# Patient Record
Sex: Male | Born: 1955 | Race: White | Hispanic: No | Marital: Single | Smoking: Former smoker
Health system: Southern US, Community
[De-identification: ages and names within clinical notes are randomized; demographics above are authoritative.]

## PROBLEM LIST (undated history)

## (undated) DIAGNOSIS — C801 Malignant (primary) neoplasm, unspecified: Secondary | ICD-10-CM

## (undated) DIAGNOSIS — Z9889 Other specified postprocedural states: Secondary | ICD-10-CM

## (undated) DIAGNOSIS — I059 Rheumatic mitral valve disease, unspecified: Secondary | ICD-10-CM

## (undated) DIAGNOSIS — Z8774 Personal history of (corrected) congenital malformations of heart and circulatory system: Secondary | ICD-10-CM

## (undated) DIAGNOSIS — E785 Hyperlipidemia, unspecified: Secondary | ICD-10-CM

## (undated) DIAGNOSIS — R011 Cardiac murmur, unspecified: Secondary | ICD-10-CM

## (undated) HISTORY — PX: HERNIA REPAIR: SHX51

## (undated) HISTORY — PX: TONSILLECTOMY: SUR1361

## (undated) HISTORY — PX: CARDIAC CATHETERIZATION: SHX172

## (undated) HISTORY — DX: Rheumatic mitral valve disease, unspecified: I05.9

## (undated) HISTORY — PX: MELANOMA EXCISION: SHX5266

## (undated) HISTORY — DX: Hyperlipidemia, unspecified: E78.5

---

## 1999-12-21 ENCOUNTER — Observation Stay (HOSPITAL_COMMUNITY): Admission: EM | Admit: 1999-12-21 | Discharge: 1999-12-22 | Payer: Self-pay | Admitting: Emergency Medicine

## 2010-10-03 ENCOUNTER — Emergency Department (HOSPITAL_COMMUNITY)
Admission: EM | Admit: 2010-10-03 | Discharge: 2010-10-04 | Disposition: A | Discharge status: Home / Self Care | Payer: 59 | Attending: Emergency Medicine | Admitting: Emergency Medicine

## 2010-10-03 DIAGNOSIS — R110 Nausea: Secondary | ICD-10-CM | POA: Insufficient documentation

## 2010-10-03 DIAGNOSIS — R55 Syncope and collapse: Secondary | ICD-10-CM | POA: Insufficient documentation

## 2010-10-03 DIAGNOSIS — E789 Disorder of lipoprotein metabolism, unspecified: Secondary | ICD-10-CM | POA: Insufficient documentation

## 2010-10-03 DIAGNOSIS — Z7982 Long term (current) use of aspirin: Secondary | ICD-10-CM | POA: Insufficient documentation

## 2010-10-03 DIAGNOSIS — R109 Unspecified abdominal pain: Secondary | ICD-10-CM | POA: Insufficient documentation

## 2010-10-03 DIAGNOSIS — R5381 Other malaise: Secondary | ICD-10-CM | POA: Insufficient documentation

## 2010-10-03 LAB — DIFFERENTIAL
Basophils Absolute: 0 10*3/uL (ref 0.0–0.1)
Lymphocytes Relative: 9 % — ABNORMAL LOW (ref 12–46)
Monocytes Absolute: 0.8 10*3/uL (ref 0.1–1.0)
Neutro Abs: 8.5 10*3/uL — ABNORMAL HIGH (ref 1.7–7.7)

## 2010-10-03 LAB — CBC
HCT: 40.9 % (ref 39.0–52.0)
Hemoglobin: 14.5 g/dL (ref 13.0–17.0)
MCHC: 35.5 g/dL (ref 30.0–36.0)
WBC: 10.3 10*3/uL (ref 4.0–10.5)

## 2010-10-03 LAB — COMPREHENSIVE METABOLIC PANEL
ALT: 35 U/L (ref 0–53)
Alkaline Phosphatase: 67 U/L (ref 39–117)
CO2: 21 mEq/L (ref 19–32)
GFR calc non Af Amer: 54 mL/min — ABNORMAL LOW (ref >60)
Glucose, Bld: 140 mg/dL — ABNORMAL HIGH (ref 70–99)
Potassium: 3.9 mEq/L (ref 3.5–5.1)
Sodium: 132 mEq/L — ABNORMAL LOW (ref 135–145)

## 2010-10-03 LAB — LIPASE, BLOOD: Lipase: 35 U/L (ref 11–59)

## 2010-10-04 ENCOUNTER — Encounter (HOSPITAL_COMMUNITY): Payer: Self-pay | Admitting: Radiology

## 2010-10-04 ENCOUNTER — Emergency Department (HOSPITAL_COMMUNITY): Payer: 59

## 2010-10-04 LAB — URINALYSIS, ROUTINE W REFLEX MICROSCOPIC
Bilirubin Urine: NEGATIVE
Hgb urine dipstick: NEGATIVE
Protein, ur: NEGATIVE mg/dL
Urobilinogen, UA: 1 mg/dL (ref 0.0–1.0)

## 2010-10-04 MED ORDER — IOHEXOL 300 MG/ML  SOLN
80.0000 mL | Freq: Once | INTRAMUSCULAR | Status: AC | PRN
  Administered 2010-10-04: 80 mL via INTRAVENOUS

## 2013-07-05 ENCOUNTER — Encounter: Payer: Self-pay | Admitting: Interventional Cardiology

## 2013-07-05 ENCOUNTER — Ambulatory Visit (INDEPENDENT_AMBULATORY_CARE_PROVIDER_SITE_OTHER): Payer: 59 | Admitting: Interventional Cardiology

## 2013-07-05 VITALS — BP 137/72 | HR 74 | Ht 70.0 in | Wt 164.0 lb

## 2013-07-05 DIAGNOSIS — I059 Rheumatic mitral valve disease, unspecified: Secondary | ICD-10-CM

## 2013-07-05 DIAGNOSIS — E782 Mixed hyperlipidemia: Secondary | ICD-10-CM | POA: Insufficient documentation

## 2013-07-05 NOTE — Progress Notes (Signed)
Patient ID: John Page, male   DOB: [birth year], 57 y.o.   MRN: [id number]    [address] [place], [place]  [address] Phone: [phone] Fax:  [phone]  Date:  07/05/2013   ID:  John Page, DOB 11-07-1955, MRN [id number]  PCP:  Lolita Patella, MD      History of Present Illness: John Page is a 57 y.o. male who had a new heart murmur found on exam in 2013. He had an echo showing MVP with moderate MR. He had no complaints at the time. He exercises on a regalar basis including running. Walks 2-3 x/weeek along wtih chinups, pushups, situps. No change in exercise tolerance. Murmur:  Denies : Chest pain.  Dizziness.  Fatigue.  Leg edema.  Orthopnea.  Syncope.     Wt Readings from Last 3 Encounters:  07/05/13 164 lb (74.39 kg)     No past medical history on file.  No current outpatient prescriptions on file.   No current facility-administered medications for this visit.    Allergies:   No Known Allergies  Social History:  The patient  reports that he quit smoking about 20 years ago. He does not have any smokeless tobacco history on file.   Family History:  The patient's family history is not on file.   ROS:  Please see the history of present illness.  No nausea, vomiting.  No fevers, chills.  No focal weakness.  No dysuria.   All other systems reviewed and negative.   PHYSICAL EXAM: VS:  BP 137/72  Pulse 74  Ht 5\' 10"  (1.778 m)  Wt 164 lb (74.39 kg)  BMI 23.53 kg/m2 Well nourished, well developed, in no acute distress HEENT: normal Neck: no JVD, no carotid bruits Cardiac:  normal S1, S2; RRR; 3/6 late systolic murmur Lungs:  clear to auscultation bilaterally, no wheezing, rhonchi or rales Abd: soft, nontender, no hepatomegaly Ext: no edema Skin: warm and dry Neuro:   no focal abnormalities noted  EKG:  NSR, NSST    ASSESSMENT AND PLAN:  Mitral valve prolapse  Start Amoxicillin Capsule, 500 MG, 4 capsule, Orally, one hour  before dental treatement, 10 day(s), 4, Refills 2 Notes: Would recommend SBE prophylaxis with amoxicillin 2 grams 1 hour before dental treatment. Moderate mitral Regurgitation. No symptoms fromthe valve leak. No evidence of CHF. Plan for echo.    2. Hyperlipidemia  Notes: Last LDL 147 from 176. In October 2014, LDL 158. HDL 47. Pravastatin was recommended but he declined. Ideally, would like to see his LDL less than 130. Minimum, his LDL should be less than 160. if this does not come down with dietary changes and exercise, would have to consider statin.    Preventive Medicine  Adult topics discussed:  Diet: healthy diet.  Exercise: 5 days a week, at least 30 minutes of aerobic exercise.      Signed, Fredric Mare, MD, [facility] 07/05/2013 10:35 AM

## 2013-07-05 NOTE — Patient Instructions (Signed)
Your physician has requested that you have an echocardiogram within the next week. Echocardiography is a painless test that uses sound waves to create images of your heart. It provides your doctor with information about the size and shape of your heart and how well your heart's chambers and valves are working. This procedure takes approximately one hour. There are no restrictions for this procedure.  Your physician wants you to follow-up in: 1 year with Dr. Eldridge Dace. You will receive a reminder letter in the mail two months in advance. If you don't receive a letter, please call our office to schedule the follow-up appointment.  Your physician recommends that you continue on your current medications as directed. Please refer to the Current Medication list given to you today.

## 2013-07-12 ENCOUNTER — Encounter: Payer: Self-pay | Admitting: Cardiology

## 2013-07-12 ENCOUNTER — Ambulatory Visit (HOSPITAL_COMMUNITY): Payer: 59 | Attending: Cardiology | Admitting: Radiology

## 2013-07-12 DIAGNOSIS — I77819 Aortic ectasia, unspecified site: Secondary | ICD-10-CM | POA: Insufficient documentation

## 2013-07-12 DIAGNOSIS — I079 Rheumatic tricuspid valve disease, unspecified: Secondary | ICD-10-CM | POA: Insufficient documentation

## 2013-07-12 DIAGNOSIS — I059 Rheumatic mitral valve disease, unspecified: Secondary | ICD-10-CM | POA: Insufficient documentation

## 2013-07-12 DIAGNOSIS — E785 Hyperlipidemia, unspecified: Secondary | ICD-10-CM | POA: Insufficient documentation

## 2013-07-12 DIAGNOSIS — I359 Nonrheumatic aortic valve disorder, unspecified: Secondary | ICD-10-CM | POA: Insufficient documentation

## 2013-07-12 NOTE — Progress Notes (Signed)
Echocardiogram performed.  

## 2014-02-05 ENCOUNTER — Telehealth: Payer: Self-pay | Admitting: Interventional Cardiology

## 2014-02-05 NOTE — Telephone Encounter (Signed)
New message    Patient calling    Does he suppose to take his antibiotic prior to colonoscopy.

## 2014-02-06 NOTE — Telephone Encounter (Signed)
Spoke with pt and let him know that we usually do not dispense antibiotics prior to colonoscopy, but he will still need to take antibiotics prior to dental.

## 2014-03-11 ENCOUNTER — Telehealth: Payer: Self-pay | Admitting: *Deleted

## 2014-03-11 MED ORDER — AMOXICILLIN 500 MG PO CAPS: ORAL_CAPSULE | ORAL | Status: DC

## 2014-03-11 NOTE — Telephone Encounter (Signed)
Refilled. Lmom to make pt aware.

## 2014-03-11 NOTE — Telephone Encounter (Signed)
Patient has a dental appointment next week and would like an antibiotic sent to [facility] [address]. He would like a call when this has been done. Thanks, MI

## 2014-07-02 ENCOUNTER — Encounter: Payer: Self-pay | Admitting: *Deleted

## 2014-07-03 ENCOUNTER — Encounter: Payer: Self-pay | Admitting: Interventional Cardiology

## 2014-07-03 ENCOUNTER — Ambulatory Visit (INDEPENDENT_AMBULATORY_CARE_PROVIDER_SITE_OTHER): Payer: 59 | Admitting: Interventional Cardiology

## 2014-07-03 VITALS — BP 126/82 | HR 64 | Ht 70.5 in | Wt 163.6 lb

## 2014-07-03 DIAGNOSIS — I059 Rheumatic mitral valve disease, unspecified: Secondary | ICD-10-CM

## 2014-07-03 DIAGNOSIS — E782 Mixed hyperlipidemia: Secondary | ICD-10-CM

## 2014-07-03 NOTE — Progress Notes (Signed)
Patient ID: John Page, male   DOB: [birth year], 58 y.o.   MRN: [id number] Patient ID: John Page, male   DOB: [birth year], 58 y.o.   MRN: [id number]    [place], [place],   [address] Phone: [phone] Fax:  [phone]  Date:  07/03/2014   ID:  John Page, DOB [birth year], MRN [id number]  PCP:  Vena Austria, MD      History of Present Illness: John Page is a 58 y.o. male who had a new heart murmur found on exam in 2013. He had an echo showing MVP with moderate MR. He had no complaints at the time. Aerobic exercise is decreased due to a knee injury.  He does chinups, pushups, situps. No change in exercise tolerance.  Murmur:  Denies : Chest pain.  Dizziness.  Fatigue.  Leg edema.  Orthopnea.  Syncope.   Walking briskly at the park on several occasions.  No cardiac sx.      Wt Readings from Last 3 Encounters:  07/03/14 163 lb 9.6 oz (74.208 kg)  07/05/13 164 lb (74.39 kg)     Past Medical History  Diagnosis Date  . Other and unspecified hyperlipidemia   . Mitral valve disorders     Current Outpatient Prescriptions  Medication Sig Dispense Refill  . amoxicillin (AMOXIL) 500 MG capsule 4 capsules 1 hour prior to dental (Patient not taking: Reported on 07/03/2014) 4 capsule 2   No current facility-administered medications for this visit.    Allergies:   No Known Allergies  Social History:  The patient  reports that he quit smoking about 21 years ago. He does not have any smokeless tobacco history on file.   Family History:  The patient's family history includes Heart attack in his mother; Heart disease in his mother; Hypertension in his brother and mother; Lung cancer in his father.   ROS:  Please see the history of present illness.  No nausea, vomiting.  No fevers, chills.  No focal weakness.  No dysuria.   All other systems reviewed and negative.   PHYSICAL EXAM: VS:  BP 126/82 mmHg  Pulse 64  Ht 5' 10.5" (1.791 m)   Wt 163 lb 9.6 oz (74.208 kg)  BMI 23.13 kg/m2 Well nourished, well developed, in no acute distress HEENT: normal Neck: no JVD, no carotid bruits Cardiac:  normal S1, S2; RRR; 3/6 late systolic murmur Lungs:  clear to auscultation bilaterally, no wheezing, rhonchi or rales Abd: soft, nontender, no hepatomegaly Ext: no edema Skin: warm and dry Neuro:   no focal abnormalities noted Psych: normal affect  EKG:  NSR, NSST    ASSESSMENT AND PLAN:  Mitral valve prolapse  Continue Amoxicillin Capsule, 500 MG, 4 capsule, Orally, one hour before dental treatement, 10 day(s), 4, Refills 2 Notes: Would recommend SBE prophylaxis with amoxicillin 2 grams 1 hour before dental treatment. Moderate mitral Regurgitation. No symptoms from the valve leak. No evidence of CHF.  If there is any change in sx, would repeat echo sooner. Otherwise, Will plan echo in 12/16.    2. Hyperlipidemia  Notes: Last LDL 147 from 176. In October 2014, LDL 158. HDL 47. Pravastatin was recommended but he declined. Ideally, would like to see his LDL less than 130. Minimum, his LDL should be less than 160. if this does not come down with dietary changes and exercise, would have to consider statin. He still is hesitant to take a statin.    Preventive  Medicine  Adult topics discussed:  Diet: healthy diet.  Exercise: 5 days a week, at least 30 minutes of aerobic exercise.      Signed, Mina Marble, MD, [facility] 07/03/2014 2:18 PM

## 2014-07-03 NOTE — Patient Instructions (Signed)
Your physician recommends that you continue on your current medications as directed. Please refer to the Current Medication list given to you today.  Your physician wants you to follow-up in: 1 year with Dr. Irish Lack. You will receive a reminder letter in the mail two months in advance. If you don't receive a letter, please call our office to schedule the follow-up appointment.  Your physician has requested that you have an echocardiogram in 1 year. Echocardiography is a painless test that uses sound waves to create images of your heart. It provides your doctor with information about the size and shape of your heart and how well your heart's chambers and valves are working. This procedure takes approximately one hour. There are no restrictions for this procedure.

## 2015-04-10 ENCOUNTER — Encounter: Payer: Self-pay | Admitting: Interventional Cardiology

## 2015-06-04 ENCOUNTER — Other Ambulatory Visit: Payer: Self-pay | Admitting: Interventional Cardiology

## 2015-06-04 MED ORDER — AMOXICILLIN 500 MG PO CAPS: ORAL_CAPSULE | ORAL | Status: DC

## 2015-06-04 NOTE — Telephone Encounter (Signed)
Spoke with pt and verified pharmacy. Sent in prescription.

## 2015-06-04 NOTE — Telephone Encounter (Signed)
Pt calling stating that he is having a dental procedure done on 06/16/2015 and he needs a refill on Amoxicillin 500 mg, before the procedure. Please advise

## 2015-06-08 NOTE — Telephone Encounter (Signed)
Please refer to the pts PCP. Thanks. 

## 2015-07-02 ENCOUNTER — Other Ambulatory Visit: Payer: Self-pay | Admitting: Interventional Cardiology

## 2015-07-02 DIAGNOSIS — I059 Rheumatic mitral valve disease, unspecified: Secondary | ICD-10-CM

## 2015-07-27 ENCOUNTER — Other Ambulatory Visit (HOSPITAL_COMMUNITY): Payer: Self-pay

## 2015-07-29 ENCOUNTER — Other Ambulatory Visit: Payer: Self-pay

## 2015-07-29 ENCOUNTER — Ambulatory Visit (HOSPITAL_COMMUNITY): Payer: Commercial Managed Care - HMO | Attending: Cardiovascular Disease

## 2015-07-29 DIAGNOSIS — Z8249 Family history of ischemic heart disease and other diseases of the circulatory system: Secondary | ICD-10-CM | POA: Insufficient documentation

## 2015-07-29 DIAGNOSIS — I059 Rheumatic mitral valve disease, unspecified: Secondary | ICD-10-CM | POA: Diagnosis not present

## 2015-07-29 DIAGNOSIS — E785 Hyperlipidemia, unspecified: Secondary | ICD-10-CM | POA: Diagnosis not present

## 2015-07-29 DIAGNOSIS — Z87891 Personal history of nicotine dependence: Secondary | ICD-10-CM | POA: Diagnosis not present

## 2015-07-30 ENCOUNTER — Ambulatory Visit (INDEPENDENT_AMBULATORY_CARE_PROVIDER_SITE_OTHER): Payer: Commercial Managed Care - HMO | Admitting: Interventional Cardiology

## 2015-07-30 ENCOUNTER — Encounter: Payer: Self-pay | Admitting: Interventional Cardiology

## 2015-07-30 VITALS — BP 128/86 | HR 60 | Ht 70.5 in | Wt 162.0 lb

## 2015-07-30 DIAGNOSIS — I340 Nonrheumatic mitral (valve) insufficiency: Secondary | ICD-10-CM | POA: Diagnosis not present

## 2015-07-30 DIAGNOSIS — E782 Mixed hyperlipidemia: Secondary | ICD-10-CM

## 2015-07-30 DIAGNOSIS — I059 Rheumatic mitral valve disease, unspecified: Secondary | ICD-10-CM | POA: Diagnosis not present

## 2015-07-30 NOTE — Progress Notes (Signed)
Patient ID: John Page, male   DOB: November 11, 1955, 60 y.o.   MRN: [id number]     Cardiology Office Note   Date:  07/30/2015   ID:  John Page, DOB 06-Apr-1956, MRN [id number]  PCP:  Vena Austria, MD    No chief complaint on file. f/u mitral regurgitation   Wt Readings from Last 3 Encounters:  07/30/15 162 lb (73.483 kg)  07/03/14 163 lb 9.6 oz (74.208 kg)  07/05/13 164 lb (74.39 kg)       History of Present Illness: John Page is a 60 y.o. male  who had a new heart murmur found on exam in 2013. He had an echo showing MVP with moderate MR. He had no complaints at the time. Aerobic exercise had decreased due to a knee injury, but this has improved. He does chinups, pushups, situps. No change in exercise tolerance.  He walks 5x/week, 30 minutes at a time.  No chest pain, dizziness, SHOB, edema, passing out.   He had an echo yesterday.   No change in exercise tolerance.    Cholesterol was 258.  He does not remember the breakdown.  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 He had an operation to remove this. There has been no sign of recurrence.    Past Medical History  Diagnosis Date  . Other and unspecified hyperlipidemia   . Mitral valve disorders     Past Surgical History  Procedure Laterality Date  . Tonsillectomy       Current Outpatient Prescriptions  Medication Sig Dispense Refill  . amoxicillin (AMOXIL) 500 MG capsule 4 capsules 1 hour prior to dental 4 capsule 0   No current facility-administered medications for this visit.    Allergies:   Review of patient's allergies indicates no known allergies.    Social History:  The patient  reports that he quit smoking about 22 years ago. He does not have any smokeless tobacco history on file.   Family History:  The patient's family history includes Heart attack in his mother; Heart disease in his mother; Hypertension in his brother and mother; Lung cancer in his father. Mother was  in her mid to late 84s.   ROS:  Please see the history of present illness.   Otherwise, review of systems are positive for knee pain- improved.   All other systems are reviewed and negative.    PHYSICAL EXAM: VS:  BP 128/86 mmHg  Pulse 60  Ht 5' 10.5" (1.791 m)  Wt 162 lb (73.483 kg)  BMI 22.91 kg/m2 , BMI Body mass index is 22.91 kg/(m^2). GEN: Well nourished, well developed, in no acute distress HEENT: normal Neck: no JVD, carotid bruits, or masses Cardiac: RRR; no murmurs, rubs, or gallops,no edema  Respiratory:  clear to auscultation bilaterally, normal work of breathing GI: soft, nontender, nondistended, + BS MS: no deformity or atrophy Skin: warm and dry, no rash Neuro:  Strength and sensation are intact Psych: euthymic mood, full affect   EKG:   The ekg ordered today demonstrates sinus bradycardia, early repolarization chnages   Recent Labs: No results found for requested labs within last 365 days.   Lipid Panel No results found for: CHOL, TRIG, HDL, CHOLHDL, VLDL, LDLCALC, LDLDIRECT   Other studies Reviewed: Additional studies/ records that were reviewed today with results demonstrating: Echo reviewed.   ASSESSMENT AND PLAN:  1. Mitral valve regurgitation: Normal left ventricular function. Stable left ventricular size. No symptoms of heart failure.  Continue SBE prophylaxis given the significant mitral valve prolapse. He has been on this for some time and is somewhat hesitant to change. Plan echocardiogram in one year. Repeated sooner if he develops symptoms. 2. Mildly elevated cholesterol per his report. We'll try to obtain lab values from his primary care doctor's office. Statin was not recommended. Continue lifestyle modifications. He was not interested in starting a statin. In the past, pravastatin was recommended but he declined.   3. Watch for change in exercise tolerance.   Current medicines are reviewed at length with the patient today.  The patient  concerns regarding his medicines were addressed.  The following changes have been made:  No change  Labs/ tests ordered today include:  No orders of the defined types were placed in this encounter.    Recommend 150 minutes/week of aerobic exercise Low fat, low carb, high fiber diet recommended  Disposition:   FU in 1 year   Teresita Madura., MD  07/30/2015 8:32 AM    [facility] [place], [place], [place]  [address] Phone: [phone]; Fax: [phone]

## 2015-07-30 NOTE — Patient Instructions (Signed)
**Note De-Identified John Page Obfuscation** Medication Instructions:  Same-no changes  Labwork: None  Testing/Procedures: Your physician has requested that you have an echocardiogram. Echocardiography is a painless test that uses sound waves to create images of your heart. It provides your doctor with information about the size and shape of your heart and how well your heart's chambers and valves are working. This procedure takes approximately one hour. There are no restrictions for this procedure. To be done in 1 year with follow up with Dr Irish Lack shortly after.   Follow-Up: Your physician wants you to follow-up in: 1 year, shortly after Echo. You will receive a reminder letter in the mail two months in advance. If you don't receive a letter, please call our office to schedule the follow-up appointment.      If you need a refill on your cardiac medications before your next appointment, please call your pharmacy.

## 2015-12-15 ENCOUNTER — Other Ambulatory Visit: Payer: Self-pay

## 2015-12-15 ENCOUNTER — Other Ambulatory Visit: Payer: Self-pay | Admitting: *Deleted

## 2015-12-15 MED ORDER — AMOXICILLIN 500 MG PO CAPS: ORAL_CAPSULE | ORAL | Status: DC

## 2016-06-27 ENCOUNTER — Other Ambulatory Visit: Payer: Self-pay | Admitting: Interventional Cardiology

## 2016-06-27 NOTE — Telephone Encounter (Signed)
Pt calling requesting a refill on Amoxicillin, pt states that he has an upcoming appointment with the Dentist. Please advise

## 2016-06-28 ENCOUNTER — Other Ambulatory Visit: Payer: Self-pay | Admitting: *Deleted

## 2016-06-28 NOTE — Telephone Encounter (Signed)
Please advise 

## 2016-06-28 NOTE — Telephone Encounter (Signed)
Will route to Dr. Irish Lack to see if he is okay with giving 1-2 refills so pt does not have to call each time he is having dental work.

## 2016-06-28 NOTE — Telephone Encounter (Signed)
Patient states that he has a dental appointment on Thursday and needs a refill on amoxicillin. He would like to know if this can be authorized for more than four capsules or at least some additional refills so that he does not have to call each time he has a dental appointment. Please advise. Thanks, MI

## 2016-06-29 MED ORDER — AMOXICILLIN 500 MG PO CAPS: ORAL_CAPSULE | ORAL | 0 refills | Status: DC

## 2016-06-29 NOTE — Telephone Encounter (Signed)
Delana Horner from Dr Mallie Mussel office called and left a msg on the refill vm requesting a refill on amoxicillin for the patient as he has a dental appointment tomorrow at 8 AM. She would like a call back at [phone] when this has been handled. Thanks, MI

## 2016-06-29 NOTE — Telephone Encounter (Signed)
John Page from Dr. Mallie Mussel office called back and notified that amoxicillin prescription for patient has been ordered and sent to the pharmacy for a one time refill for now to cover dental procedure tomorrow until Dr. Irish Lack gets back to [place] for approval for multiple refills. She stated she would notify the patient.

## 2016-07-01 NOTE — Telephone Encounter (Signed)
OK to refill amoxicillin for SBE prophylaxis.

## 2016-07-04 NOTE — Telephone Encounter (Signed)
**Note De-identified Olive Motyka Obfuscation** Already addressed. Thanks

## 2016-07-29 ENCOUNTER — Ambulatory Visit (HOSPITAL_COMMUNITY): Payer: Commercial Managed Care - HMO | Attending: Cardiology

## 2016-07-29 ENCOUNTER — Other Ambulatory Visit: Payer: Self-pay

## 2016-07-29 DIAGNOSIS — E785 Hyperlipidemia, unspecified: Secondary | ICD-10-CM | POA: Diagnosis not present

## 2016-07-29 DIAGNOSIS — I340 Nonrheumatic mitral (valve) insufficiency: Secondary | ICD-10-CM

## 2016-07-29 DIAGNOSIS — I081 Rheumatic disorders of both mitral and tricuspid valves: Secondary | ICD-10-CM | POA: Insufficient documentation

## 2016-07-29 DIAGNOSIS — Z87891 Personal history of nicotine dependence: Secondary | ICD-10-CM | POA: Diagnosis not present

## 2016-08-07 NOTE — Progress Notes (Signed)
Patient ID: John Page, male   DOB: 1955-10-01, 61 y.o.   MRN: [id number]     Cardiology Office Note   Date:  08/08/2016   ID:  John Page, DOB 1956-02-01, MRN [id number]  PCP:  Vena Austria, MD    Chief Complaint  Patient presents with  . Mitral valve insufficiency, unspecified etiology  f/u mitral regurgitation   Wt Readings from Last 3 Encounters:  08/08/16 164 lb 6.4 oz (74.6 kg)  07/30/15 162 lb (73.5 kg)  07/03/14 163 lb 9.6 oz (74.2 kg)       History of Present Illness: [name] is a 61 y.o. male  who had a new heart murmur found on exam in 2013. He had an echo showing MVP with moderate MR. He had no complaints at the time. Aerobic exercise had decreased due to a knee injury, but this has improved. He does chinups, pushups, situps. He uses some dumbbells also.  No change in exercise tolerance.  He walks 5x/week, 30 minutes at a time.   No chest pain, dizziness, SHOB, edema, passing out.  No change in exercise tolerance.    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 He had an operation to remove this. There has been no sign of recurrence.  Most recent echo a few weeks ago showed: - Left ventricle: The cavity size was normal. Wall thickness was   normal. Systolic function was normal. The estimated ejection   fraction was in the range of 60% to 65%. - Mitral valve: Cannot r/o flail segment of posterior leaflet with   severe prolapse. MR hard to quantitatte as it is eccentric but by   color flow   appears moderate. Given possible flail segment consider TEE to   further evaluate. There was moderate regurgitation.    Past Medical History:  Diagnosis Date  . Mitral valve disorders(424.0)   . Other and unspecified hyperlipidemia     Past Surgical History:  Procedure Laterality Date  . TONSILLECTOMY       Current Outpatient Prescriptions  Medication Sig Dispense Refill  . amoxicillin (AMOXIL) 500 MG capsule 4 capsules 1 hour  prior to dental 4 capsule 0  . pravastatin (PRAVACHOL) 40 MG tablet Take 40 mg by mouth daily.     No current facility-administered medications for this visit.     Allergies:   Patient has no known allergies.    Social History:  The patient  reports that he quit smoking about 23 years ago. He has never used smokeless tobacco.   Family History:  The patient's family history includes Heart attack in his mother; Heart disease in his mother; Hypertension in his brother and mother; Lung cancer in his father. Mother was in her mid to late 48s.   ROS:  Please see the history of present illness.   Otherwise, review of systems are positive for decreased libido- maybe since starting statin.   All other systems are reviewed and negative.    PHYSICAL EXAM: VS:  BP 140/74   Pulse 63   Ht 5\' 10"  (1.778 m)   Wt 164 lb 6.4 oz (74.6 kg)   SpO2 98%   BMI 23.59 kg/m  , BMI Body mass index is 23.59 kg/m. GEN: Well nourished, well developed, in no acute distress  HEENT: normal  Neck: no JVD, carotid bruits, or masses Cardiac: RRR;  Mid to late systolic 3/6 murmur, no rubs, or gallops,no edema  Respiratory:  clear to auscultation  bilaterally, normal work of breathing GI: soft, nontender, nondistended, + BS MS: no deformity or atrophy  Skin: warm and dry, no rash Neuro:  Strength and sensation are intact Psych: euthymic mood, full affect   EKG:   The ekg ordered today demonstrates sinus bradycardia, early repolarization chnages   Recent Labs: No results found for requested labs within last 8760 hours.   Lipid Panel No results found for: CHOL, TRIG, HDL, CHOLHDL, VLDL, LDLCALC, LDLDIRECT   Other studies Reviewed: Additional studies/ records that were reviewed today with results demonstrating: Echo reviewed.   ASSESSMENT AND PLAN:  1. Mitral valve regurgitation: Normal left ventricular function by most recent echo. Stable left ventricular size. No symptoms of heart failure.  Unable to  r/o flail leaflet.  Continue SBE prophylaxis given the significant mitral valve prolapse. He has been on this for some time and is somewhat hesitant to change. Plan echocardiogram in one year. Repeated sooner if he develops symptoms.  WOuld have to check TEE to look for flail leaflet if he had worsening sx.  He asked about decreasing the frequency of his echo, but he is agreeable to annual.  We discussed the rationale for his SBE prophylaxis as well. He is agreeable. 2. Mildly elevated cholesterol per his report. We'll try to obtain lab values from his primary care doctor's office.TC 248, LDL 171, HDL 56, TG 107 was in 12/17.  He was started on pravastatin.  He denies muscle pain.  He had  a liver problem in the past with a different statin.  He questions some decreased libido since starting the medicine so he may try for a lower dose.  I agree that he benefits from treatment.  He is not sure that this effect has happened since starting the pravastatin. He will discuss with Dr. Alyson Ingles. 3. Watch for change in exercise tolerance.     Current medicines are reviewed at length with the patient today.  The patient concerns regarding his medicines were addressed.  The following changes have been made:  No change  Labs/ tests ordered today include:   Orders Placed This Encounter  Procedures  . EKG 12-Lead    Recommend 150 minutes/week of aerobic exercise Low fat, low carb, high fiber diet recommended  Disposition:   FU in 1 year   Signed, Larae Grooms, MD  08/08/2016 8:57 AM    [facility] [place], [place], [place]  [address] Phone: [phone]; Fax: [phone]

## 2016-08-08 ENCOUNTER — Ambulatory Visit (INDEPENDENT_AMBULATORY_CARE_PROVIDER_SITE_OTHER): Payer: Commercial Managed Care - HMO | Admitting: Interventional Cardiology

## 2016-08-08 ENCOUNTER — Encounter: Payer: Self-pay | Admitting: Interventional Cardiology

## 2016-08-08 ENCOUNTER — Encounter (INDEPENDENT_AMBULATORY_CARE_PROVIDER_SITE_OTHER): Payer: Self-pay

## 2016-08-08 VITALS — BP 140/74 | HR 63 | Ht 70.0 in | Wt 164.4 lb

## 2016-08-08 DIAGNOSIS — R6882 Decreased libido: Secondary | ICD-10-CM

## 2016-08-08 DIAGNOSIS — I340 Nonrheumatic mitral (valve) insufficiency: Secondary | ICD-10-CM

## 2016-08-08 DIAGNOSIS — E782 Mixed hyperlipidemia: Secondary | ICD-10-CM | POA: Diagnosis not present

## 2016-08-08 DIAGNOSIS — I059 Rheumatic mitral valve disease, unspecified: Secondary | ICD-10-CM | POA: Diagnosis not present

## 2016-08-08 MED ORDER — AMOXICILLIN 500 MG PO CAPS: ORAL_CAPSULE | ORAL | 3 refills | Status: DC

## 2016-08-08 NOTE — Patient Instructions (Signed)
**Note De-Identified Daquann Merriott Obfuscation** Medication Instructions:  Same-no changes  Labwork: None  Testing/Procedures: Your physician has requested that you have an echocardiogram in 1 year just prior to follow up. Echocardiography is a painless test that uses sound waves to create images of your heart. It provides your doctor with information about the size and shape of your heart and how well your heart's chambers and valves are working. This procedure takes approximately one hour. There are no restrictions for this procedure.  Follow-Up: Your physician wants you to follow-up in: 1 year. You will receive a reminder letter in the mail two months in advance. If you don't receive a letter, please call our office to schedule the follow-up appointment.     If you need a refill on your cardiac medications before your next appointment, please call your pharmacy.

## 2017-07-24 ENCOUNTER — Other Ambulatory Visit (HOSPITAL_COMMUNITY): Payer: Commercial Managed Care - HMO

## 2017-07-25 ENCOUNTER — Ambulatory Visit (HOSPITAL_COMMUNITY): Payer: 59 | Attending: Cardiology

## 2017-07-25 ENCOUNTER — Other Ambulatory Visit: Payer: Self-pay

## 2017-07-25 DIAGNOSIS — I071 Rheumatic tricuspid insufficiency: Secondary | ICD-10-CM | POA: Insufficient documentation

## 2017-07-25 DIAGNOSIS — I059 Rheumatic mitral valve disease, unspecified: Secondary | ICD-10-CM

## 2017-07-25 DIAGNOSIS — I340 Nonrheumatic mitral (valve) insufficiency: Secondary | ICD-10-CM | POA: Diagnosis present

## 2017-07-25 DIAGNOSIS — I341 Nonrheumatic mitral (valve) prolapse: Secondary | ICD-10-CM | POA: Diagnosis not present

## 2017-08-14 NOTE — Progress Notes (Signed)
Cardiology Office Note   Date:  08/16/2017   ID:  John Page, DOB 03-02-1956, MRN [id number]  PCP:  John Dus, MD    No chief complaint on file.  Mitral regurgitation  Wt Readings from Last 3 Encounters:  08/16/17 161 lb (73 kg)  08/08/16 164 lb 6.4 oz (74.6 kg)  07/30/15 162 lb (73.5 kg)       History of Present Illness: John Page is a 62 y.o. male  who had a new heart murmur found on exam in 2013. He had an echo showing MVP with moderate MR.  History of melanoma requiring surgery.    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 MR difficult   to quantitate due to eccentricity of jet that is directed   anteriorly and toward the septum. Unable to perfom accurate   assessment of MR with PISA. Consider TEE for better assessment.   Mildly dilated ascending aorta.  No change in LV size.  Normal LA size.    He had a mumps vaccine and this caused some testicular pain.   Denies : Chest pain. Dizziness. Leg edema. Nitroglycerin use. Orthopnea. Palpitations. Paroxysmal nocturnal dyspnea. Shortness of breath. Syncope.   He continues to try to exercise.    He was on Pravastatin but he stopped this on his own.  No side effects.    He has taken a job at [employer].     Past Medical History:  Diagnosis Date  . Mitral valve disorders(424.0)   . Other and unspecified hyperlipidemia     Past Surgical History:  Procedure Laterality Date  . TONSILLECTOMY       Current Outpatient Medications  Medication Sig Dispense Refill  . amoxicillin (AMOXIL) 500 MG capsule 4 capsules 1 hour prior to dental 4 capsule 3   No current facility-administered medications for this visit.     Allergies:   Patient has no known allergies.    Social History:  The patient  reports that he quit smoking about 24 years ago. he has never used smokeless tobacco.   Family History:  The patient's family history includes Heart attack in his mother;  Heart disease in his mother; Hypertension in his brother and mother; Lung cancer in his father.    ROS:  Please see the history of present illness.   Otherwise, review of systems are positive for recent job change, and breakup with his irlfriend.   All other systems are reviewed and negative.    PHYSICAL EXAM: VS:  BP 128/82   Pulse 63   Ht 5\' 10"  (1.778 m)   Wt 161 lb (73 kg)   SpO2 96%   BMI 23.10 kg/m  , BMI Body mass index is 23.1 kg/m. GEN: Well nourished, well developed, in no acute distress  HEENT: normal  Neck: no JVD, carotid bruits, or masses Cardiac: RRR; 2/6 late systolic murmurs,; no rubs, or gallops,no edema  Respiratory:  clear to auscultation bilaterally, normal work of breathing GI: soft, nontender, nondistended, + BS MS: no deformity or atrophy  Skin: warm and dry, no rash Neuro:  Strength and sensation are intact Psych: euthymic mood, full affect   EKG:   The ekg ordered today demonstrates Normal sinus rhythm, no ST segment changes   Recent Labs: No results found for requested labs within last 8760 hours.   Lipid Panel No results found for: CHOL, TRIG, HDL, CHOLHDL, VLDL, LDLCALC, LDLDIRECT   Other studies Reviewed:  Additional studies/ records that were reviewed today with results demonstrating: .   ASSESSMENT AND PLAN:  1. Mitral valve regurgitation: No signs of CHF.  We discussed TEE to better determine mitral valve structure.  At this time, he would like to hold off.  I think this is reasonable.  He will let us know if his exercise tolerance changes.  Plan for echo in one year to eval LV function.  If there is a change in LV function or LA size, would plan TEE and likely surgical eval.  COntinue SBE prophylaxis.  2. Elevated cholesterol: Started pravastatin, but he stopped it.  He watched some video on the internet and felt he did not want to take it.  He will f/u with PMD to have lipids rechecked.  3. Continue exercise and watch for change in  exercise tolerance as a sign hat MR has gotten worse.    Current medicines are reviewed at length with the patient today.  The patient concerns regarding his medicines were addressed.  The following changes have been made:  No change  Labs/ tests ordered today include:  No orders of the defined types were placed in this encounter.   Recommend 150 minutes/week of aerobic exercise Low fat, low carb, high fiber diet recommended  Disposition:   FU in 1 year   Signed, Larae Grooms, MD  08/16/2017 2:15 PM    [facility] [address], [place], [place]  [address] Phone: [phone]; Fax: [phone]

## 2017-08-16 ENCOUNTER — Ambulatory Visit: Payer: 59 | Admitting: Interventional Cardiology

## 2017-08-16 ENCOUNTER — Encounter: Payer: Self-pay | Admitting: Interventional Cardiology

## 2017-08-16 VITALS — BP 128/82 | HR 63 | Ht 70.0 in | Wt 161.0 lb

## 2017-08-16 DIAGNOSIS — I340 Nonrheumatic mitral (valve) insufficiency: Secondary | ICD-10-CM

## 2017-08-16 DIAGNOSIS — E782 Mixed hyperlipidemia: Secondary | ICD-10-CM

## 2017-08-16 DIAGNOSIS — I341 Nonrheumatic mitral (valve) prolapse: Secondary | ICD-10-CM | POA: Diagnosis not present

## 2017-08-16 NOTE — Patient Instructions (Signed)
Medication Instructions:  Your physician recommends that you continue on your current medications as directed. Please refer to the Current Medication list given to you today.   Labwork: None ordered  Testing/Procedures: Your physician has requested that you have an echocardiogram in 1 year (to be done prior to office visit). Echocardiography is a painless test that uses sound waves to create images of your heart. It provides your doctor with information about the size and shape of your heart and how well your heart's chambers and valves are working. This procedure takes approximately one hour. There are no restrictions for this procedure.    Follow-Up: Your physician wants you to follow-up in: 1 year with Dr. Irish Lack. You will receive a reminder letter in the mail two months in advance. If you don't receive a letter, please call our office to schedule the follow-up appointment.   Any Other Special Instructions Will Be Listed Below (If Applicable).     If you need a refill on your cardiac medications before your next appointment, please call your pharmacy.

## 2017-09-22 ENCOUNTER — Telehealth: Payer: Self-pay | Admitting: *Deleted

## 2017-09-22 NOTE — Telephone Encounter (Signed)
   [facility] Pre-operative Risk Assessment    Request for surgical clearance:  1. What type of surgery is being performed? Hernia Surgery  2. When is this surgery scheduled? Pending Clearance  3. What type of clearance is required (medical clearance vs. Pharmacy clearance to hold med vs. Both)? Both  4. Are there any medications that need to be held prior to surgery and how long? Pending Clearance ad Clearing Provider  5. Practice name and name of physician performing surgery? [facility]  6. What is your office phone and fax number? AttenBenjiman Core, RMA P#[phone], [phone]  7. Anesthesia type (None, local, MAC, general) ? General   John Page 09/22/2017, 2:45 PM  _________________________________________________________________   (provider comments below)

## 2017-09-25 NOTE — Telephone Encounter (Signed)
   Primary Cardiologist: Dr. Irish Lack   Chart reviewed as part of pre-operative protocol coverage.  John Page was last seen on 08/16/17 by Dr. Irish Lack.  It was outlined at that visit that pt had severe MVP noted on TTE, but was asymptomatic. Dr. Irish Lack discussed TEE to better assess the mitral valve, however pt declined.   Pt is now needing surgical clearance for hernial surgery. I will route to Dr. Irish Lack for further recs. Will pt need TEE for MVP prior to clearance? Clearance pending until we hear from Dr. Irish Lack.     Lyda Jester, PA-C 09/25/2017, 4:47 PM

## 2017-09-26 NOTE — Telephone Encounter (Signed)
No TEE needed before surgery.  No ischemia testing needed before hernia surgery.

## 2017-09-26 NOTE — Telephone Encounter (Signed)
   Primary Cardiologist: Dr. Irish Lack   Chart reviewed as part of pre-operative protocol coverage. Given past medical history and time since last visit, based on ACC/AHA guidelines, John Page would be at acceptable risk for the planned procedure without further cardiovascular testing.   I will route this recommendation to the requesting party via Epic fax function and remove from pre-op pool.  Please call with questions.  Cecilie Kicks, NP 09/26/2017, 5:11 PM

## 2017-09-26 NOTE — Telephone Encounter (Signed)
Informed pt he was cleared from cardiology standpoint.

## 2017-09-27 ENCOUNTER — Ambulatory Visit: Payer: Self-pay | Admitting: General Surgery

## 2017-10-02 NOTE — Pre-Procedure Instructions (Signed)
ANTONIOUS OMAHONEY  10/02/2017      [id number] - [place],  - [place] [address]  [place] [address] Phone: [phone] Fax: [phone]    Your procedure is scheduled on March 25.  Report to [facility] Admitting at 1100 A.M.  Call this number if you have problems the morning of surgery:  [phone]   Remember:  Do not eat food or drink liquids after midnight.  Take these medicines the morning of surgery with A SIP OF WATER tylenol if needed  Stop taking aspirin, BC's, Goody's, herbal medications, Fish Oil, Aleve, Ibuprofen, Advil, Motrin, Vitamins     Do not wear jewelry, make-up or nail polish.  Do not wear lotions, powders, or perfumes, or deodorant.  Do not shave 48 hours prior to surgery.  Men may shave face and neck.  Do not bring valuables to the hospital.  [facility] is not responsible for any belongings or valuables.  Contacts, dentures or bridgework may not be worn into surgery.  Leave your suitcase in the car.  After surgery it may be brought to your room.  For patients admitted to the hospital, discharge time will be determined by your treatment team.  Patients discharged the day of surgery will not be allowed to drive home.    Special instructions:   [facility]- Preparing For Surgery  Before surgery, you can play an important role. Because skin is not sterile, your skin needs to be as free of germs as possible. You can reduce the number of germs on your skin by washing with CHG (chlorahexidine gluconate) Soap before surgery.  CHG is an antiseptic cleaner which kills germs and bonds with the skin to continue killing germs even after washing.  Please do not use if you have an allergy to CHG or antibacterial soaps. If your skin becomes reddened/irritated stop using the CHG.  Do not shave (including legs and underarms) for at least 48 hours prior to first CHG shower. It is OK  to shave your face.  Please follow these instructions carefully.   1. Shower the NIGHT BEFORE SURGERY and the MORNING OF SURGERY with CHG.   2. If you chose to wash your hair, wash your hair first as usual with your normal shampoo.  3. After you shampoo, rinse your hair and body thoroughly to remove the shampoo.  4. Use CHG as you would any other liquid soap. You can apply CHG directly to the skin and wash gently with a scrungie or a clean washcloth.   5. Apply the CHG Soap to your body ONLY FROM THE NECK DOWN.  Do not use on open wounds or open sores. Avoid contact with your eyes, ears, mouth and genitals (private parts). Wash Face and genitals (private parts)  with your normal soap.  6. Wash thoroughly, paying special attention to the area where your surgery will be performed.  7. Thoroughly rinse your body with warm water from the neck down.  8. DO NOT shower/wash with your normal soap after using and rinsing off the CHG Soap.  9. Pat yourself dry with a CLEAN TOWEL.  10. Wear CLEAN PAJAMAS to bed the night before surgery, wear comfortable clothes the morning of surgery  11. Place CLEAN SHEETS on your bed the night of your first shower and DO NOT SLEEP WITH PETS.    Day of Surgery: Do not apply any deodorants/lotions. Please wear clean clothes  to the hospital/surgery center.      Please read over the following fact sheets that you were given. Pain Booklet, Coughing and Deep Breathing and Surgical Site Infection Prevention

## 2017-10-03 ENCOUNTER — Encounter (HOSPITAL_COMMUNITY): Payer: Self-pay

## 2017-10-03 ENCOUNTER — Encounter (HOSPITAL_COMMUNITY)
Admission: RE | Admit: 2017-10-03 | Discharge: 2017-10-03 | Disposition: A | Discharge status: Home / Self Care | Payer: 59 | Source: Ambulatory Visit | Attending: General Surgery | Admitting: General Surgery

## 2017-10-03 ENCOUNTER — Other Ambulatory Visit: Payer: Self-pay

## 2017-10-03 DIAGNOSIS — K4090 Unilateral inguinal hernia, without obstruction or gangrene, not specified as recurrent: Secondary | ICD-10-CM | POA: Diagnosis not present

## 2017-10-03 DIAGNOSIS — Z01812 Encounter for preprocedural laboratory examination: Secondary | ICD-10-CM | POA: Diagnosis not present

## 2017-10-03 HISTORY — DX: Cardiac murmur, unspecified: R01.1

## 2017-10-03 HISTORY — DX: Malignant (primary) neoplasm, unspecified: C80.1

## 2017-10-03 LAB — CBC
HCT: 43.7 % (ref 39.0–52.0)
Hemoglobin: 14.7 g/dL (ref 13.0–17.0)
MCH: 32.3 pg (ref 26.0–34.0)
MCHC: 33.6 g/dL (ref 30.0–36.0)
MCV: 96 fL (ref 78.0–100.0)
PLATELETS: 229 10*3/uL (ref 150–400)
RBC: 4.55 MIL/uL (ref 4.22–5.81)
RDW: 12.9 % (ref 11.5–15.5)
WBC: 6.7 10*3/uL (ref 4.0–10.5)

## 2017-10-03 LAB — BASIC METABOLIC PANEL
Anion gap: 9 (ref 5–15)
BUN: 16 mg/dL (ref 6–20)
CALCIUM: 9.6 mg/dL (ref 8.9–10.3)
CHLORIDE: 108 mmol/L (ref 101–111)
CO2: 22 mmol/L (ref 22–32)
CREATININE: 1.25 mg/dL — AB (ref 0.61–1.24)
GFR calc Af Amer: >60 mL/min (ref >60)
GFR calc non Af Amer: 60 mL/min — ABNORMAL LOW (ref >60)
Glucose, Bld: 96 mg/dL (ref 65–99)
Potassium: 4.5 mmol/L (ref 3.5–5.1)
Sodium: 139 mmol/L (ref 135–145)

## 2017-10-03 NOTE — Progress Notes (Signed)
PCP: Dr. Maury Dus Cardiologist: Dr. Isabella Bowens note in [facility]  EKG: Jan 2019 CXR: Denies ECHO: Jan 2019 Stress Test: Denies Cardiac Cath: Denies  Patient denies shortness of breath, fever, cough, and chest pain at PAT appointment.  Patient verbalized understanding of instructions provided today at the PAT appointment.  Patient asked to review instructions at home and day of surgery.

## 2017-10-04 ENCOUNTER — Encounter (HOSPITAL_COMMUNITY): Payer: Self-pay

## 2017-10-04 NOTE — Progress Notes (Signed)
Anesthesia Chart Review:  Pt is a 62 year old male scheduled for L inguinal hernia repair, insertion of mesh on 10/09/2017 with Georganna Skeans, MD  - PCP is Maury Dus, MD - Cardiologist is Larae Grooms, MD. Last office visit 08/16/17. Cleared for surgery by Cecilie Kicks, NP 09/26/17  PMH includes:  Severe MVP, hyperlipidemia, melanoma. Former smoker (quit 07/05/88). BMI 23  Medications reviewed.  BP (!) 152/78   Pulse 67   Temp (!) 36.4 C   Resp 20   Ht 5\' 10"  (1.778 m)   Wt 161 lb 1.6 oz (73.1 kg)   SpO2 97%   BMI 23.12 kg/m   Preoperative labs reviewed.    EKG 08/16/17: NSR  Echo 07/25/17 - Left ventricle: The cavity size was normal. Systolic function was normal. The estimated ejection fraction was in the range of 60% to 65%. Wall motion was normal; there were no regional wall motion abnormalities. Left ventricular diastolic function parameters were normal. - Aorta: Ascending aortic diameter: 39 mm (S). - Ascending aorta: The ascending aorta was mildly dilated. - Mitral valve: Severe, holosystolic prolapse, involving the posterior leaflet. MR difficult to quantitate due to eccentricity of jet that is directed anteriorly and toward the septum. Unable to perfom accurate assessment of MR with PISA. - Pulmonic valve: There was trivial regurgitation. - Impressions: Normal LVF, severe MVP of the posterior MV leaflet. MR difficult to quantitate due to eccentricity of jet that is directed anteriorly and toward the septum. Unable to perfom accurate assessment of MR with PISA. Consider TEE for better assessment. Mildly dilated ascending aorta.\  If no changes, I anticipate pt can proceed with surgery as scheduled.   Willeen Cass, FNP-BC [facility] Stay [facility]/Anesthesiology Phone: [phone] 10/04/2017 4:16 PM

## 2017-10-08 NOTE — Anesthesia Preprocedure Evaluation (Addendum)
Anesthesia Evaluation  Patient identified by MRN, date of birth, ID band Patient awake    Reviewed: Allergy & Precautions, NPO status , Patient's Chart, lab work & pertinent test results  Airway Mallampati: I  TM Distance: >3 FB Neck ROM: Full    Dental   Pulmonary former smoker,    Pulmonary exam normal        Cardiovascular Normal cardiovascular exam+ Valvular Problems/Murmurs MR      Neuro/Psych    GI/Hepatic   Endo/Other    Renal/GU      Musculoskeletal   Abdominal   Peds  Hematology   Anesthesia Other Findings John Page  ECHO COMPLETE WO IMAGING ENHANCING AGENT AND WITH 3D  Order# [id number]  Reading physician: Sueanne Margarita, MD Ordering physician: Jettie Booze, MD Study date: 07/25/17 Result Notes for ECHOCARDIOGRAM COMPLETE   Notes recorded by Loren Racer, LPN on 07/24/107 at 3:23 PM EST Informed pt of echo results. Pt verbalized understanding. ------  Notes recorded by Jettie Booze, MD on 07/26/2017 at 1:37 PM EST No significant change in LV function, LV size or mitral valve function.   Study Result   Result status: Final result                          [facility]                        [address], [place] [address]                            [phone]  ------------------------------------------------------------------- Echocardiography  Patient:    John Page, John Page MR #:       [id number] Study Date: 07/25/2017 Gender:     M Age:        62 Height:     177.8 cm Weight:     74.6 kg BSA:        1.92 m^2 Pt. Status: Room:   [place]us Leas, Charlann Lange  REFERRING    [place], [place]  ATTENDING    Ena Dawley, M.D.  PERFORMING   [facility], Outpatient  SONOGRAPHER  [facility], RDCS  cc:  ------------------------------------------------------------------- LV EF: 60% -    65%  ------------------------------------------------------------------- Indications:      Mitral Valve Disease (I05.9).  ------------------------------------------------------------------- History:   PMH:   Mitral valve prolapse.  Risk factors:  Family history of coronary artery disease. Former tobacco use. Dyslipidemia.  ------------------------------------------------------------------- Study Conclusions  - Left ventricle: The cavity size was normal. Systolic function was   normal. The estimated ejection fraction was in the range of 60%   to 65%. Wall motion was normal; there were no regional wall   motion abnormalities. Left ventricular diastolic function   parameters were normal. - Aorta: Ascending aortic diameter: 39 mm (S). - Ascending aorta: The ascending aorta was mildly dilated. - Mitral valve: Severe, holosystolicprolapse, involving the   posterior leaflet. MR difficult to quantitate due to eccentricity   of jet that is directed anteriorly and toward the septum. Unable   to perfom accurate assessment of MR with PISA. - Pulmonic valve: There was trivial regurgitation.  Impressions:  - Normal LVF, severe MVP of the posterior MV  leaflet. MR difficult   to quantitate due to eccentricity of jet that is directed   anteriorly and toward the septum. Unable to perfom accurate   assessment of MR with PISA. Consider TEE for better assessment.   Mildly dilated ascending aorta.  ------------------------------------------------------------------- Study data:  Comparison was made to the study of 07/29/2016.  Study status:  Routine.  Procedure:  Transthoracic echocardiography. Image quality was adequate.          Echocardiography.  M-mode, complete 2D, 3D, spectral Doppler, and color Doppler.  Birthdate: Patient birthdate: August 12, 1955.  Age:  Patient is 62 yr old.  Sex: Gender: male.    BMI: 23.6 kg/m^2.  Blood pressure:     140/74 Patient status:  Outpatient.  Study date:   Study date: 07/25/2017. Study time: 11:31 AM.  Location:  [facility]  -------------------------------------------------------------------  ------------------------------------------------------------------- Left ventricle:  The cavity size was normal. Systolic function was normal. The estimated ejection fraction was in the range of 60% to 65%. Wall motion was normal; there were no regional wall motion abnormalities. The transmitral flow pattern was normal. The deceleration time of the early transmitral flow velocity was normal. The pulmonary vein flow pattern was normal. The tissue Doppler parameters were normal. Left ventricular diastolic function parameters were normal.  ------------------------------------------------------------------- Aortic valve:   Trileaflet; moderately thickened, moderately calcified leaflets. Mobility was not restricted.  Doppler: Transvalvular velocity was within the normal range. There was no stenosis. There was no regurgitation.  ------------------------------------------------------------------- Aorta:  Aortic root: The aortic root was normal in size. Ascending aorta: The ascending aorta was mildly dilated.  ------------------------------------------------------------------- Mitral valve:  Mobility was not restricted.  Severe, holosystolicprolapse, involving the posterior leaflet.  Doppler: Transvalvular velocity was within the normal range. There was no evidence for stenosis. MR difficult to quantitate due to eccentricity of jet that is directed anteriorly and toward the septum. Unable to perfom accurate assessment of MR with PISA. Peak gradient (D): 4 mm Hg.  ------------------------------------------------------------------- Left atrium:  The atrium was normal in size.  ------------------------------------------------------------------- Right ventricle:  The cavity size was normal. Wall thickness was normal. Systolic function was  normal.  ------------------------------------------------------------------- Pulmonic valve:    Structurally normal valve.   Cusp separation was normal.  Doppler:  Transvalvular velocity was within the normal range. There was no evidence for stenosis. There was trivial regurgitation.  ------------------------------------------------------------------- Tricuspid valve:   Structurally normal valve.    Doppler: Transvalvular velocity was within the normal range. There was mild regurgitation.  ------------------------------------------------------------------- Pulmonary artery:   The main pulmonary artery was normal-sized. Systolic pressure was within the normal range.  ------------------------------------------------------------------- Right atrium:  The atrium was normal in size.  ------------------------------------------------------------------- Pericardium:  There was no pericardial effusion.  ------------------------------------------------------------------- Systemic veins: Inferior vena cava: The vessel was normal in size.  ------------------------------------------------------------------- Measurements   Left ventricle                         Value        Reference  LV ID, ED, PLAX     Reproductive/Obstetrics                            Anesthesia Physical Anesthesia Plan  ASA: III  Anesthesia Plan: General   Post-op Pain Management:  Regional for Post-op pain   Induction: Intravenous  PONV Risk Score and Plan: 2 and Ondansetron and Treatment may vary due to age or medical  condition  Airway Management Planned: LMA  Additional Equipment:   Intra-op Plan:   Post-operative Plan: Extubation in OR  Informed Consent: I have reviewed the patients History and Physical, chart, labs and discussed the procedure including the risks, benefits and alternatives for the proposed anesthesia with the patient or authorized representative who  has indicated his/her understanding and acceptance.     Plan Discussed with: CRNA and Surgeon  Anesthesia Plan Comments:        Anesthesia Quick Evaluation

## 2017-10-09 ENCOUNTER — Encounter (HOSPITAL_COMMUNITY)
Admission: RE | Disposition: A | Discharge status: Home / Self Care | Payer: Self-pay | Source: Ambulatory Visit | Attending: General Surgery

## 2017-10-09 ENCOUNTER — Ambulatory Visit (HOSPITAL_COMMUNITY): Payer: 59 | Admitting: Emergency Medicine

## 2017-10-09 ENCOUNTER — Encounter (HOSPITAL_COMMUNITY): Payer: Self-pay

## 2017-10-09 ENCOUNTER — Ambulatory Visit (HOSPITAL_COMMUNITY)
Admission: RE | Admit: 2017-10-09 | Discharge: 2017-10-09 | Disposition: A | Discharge status: Home / Self Care | Payer: 59 | Source: Ambulatory Visit | Attending: General Surgery | Admitting: General Surgery

## 2017-10-09 ENCOUNTER — Ambulatory Visit (HOSPITAL_COMMUNITY): Payer: 59 | Admitting: Anesthesiology

## 2017-10-09 DIAGNOSIS — R011 Cardiac murmur, unspecified: Secondary | ICD-10-CM | POA: Insufficient documentation

## 2017-10-09 DIAGNOSIS — Z87891 Personal history of nicotine dependence: Secondary | ICD-10-CM | POA: Insufficient documentation

## 2017-10-09 DIAGNOSIS — E7800 Pure hypercholesterolemia, unspecified: Secondary | ICD-10-CM | POA: Insufficient documentation

## 2017-10-09 DIAGNOSIS — K4090 Unilateral inguinal hernia, without obstruction or gangrene, not specified as recurrent: Secondary | ICD-10-CM | POA: Diagnosis present

## 2017-10-09 DIAGNOSIS — I341 Nonrheumatic mitral (valve) prolapse: Secondary | ICD-10-CM | POA: Insufficient documentation

## 2017-10-09 DIAGNOSIS — Z85820 Personal history of malignant melanoma of skin: Secondary | ICD-10-CM | POA: Insufficient documentation

## 2017-10-09 HISTORY — PX: INGUINAL HERNIA REPAIR: SHX194

## 2017-10-09 HISTORY — PX: INSERTION OF MESH: SHX5868

## 2017-10-09 SURGERY — REPAIR, HERNIA, INGUINAL, ADULT
Anesthesia: General | Laterality: Left

## 2017-10-09 MED ORDER — BUPIVACAINE-EPINEPHRINE 0.25% -1:200000 IJ SOLN
INTRAMUSCULAR | Status: DC | PRN
  Administered 2017-10-09: 10 mL

## 2017-10-09 MED ORDER — CHLORHEXIDINE GLUCONATE CLOTH 2 % EX PADS: 6.0000 | MEDICATED_PAD | Freq: Once | CUTANEOUS | Status: DC

## 2017-10-09 MED ORDER — FENTANYL CITRATE (PF) 250 MCG/5ML IJ SOLN
INTRAMUSCULAR | Status: AC
  Filled 2017-10-09: qty 5

## 2017-10-09 MED ORDER — MIDAZOLAM HCL 2 MG/2ML IJ SOLN
INTRAMUSCULAR | Status: AC
  Filled 2017-10-09: qty 2

## 2017-10-09 MED ORDER — FENTANYL CITRATE (PF) 100 MCG/2ML IJ SOLN
INTRAMUSCULAR | Status: AC
  Administered 2017-10-09: 100 ug via INTRAVENOUS
  Filled 2017-10-09: qty 2

## 2017-10-09 MED ORDER — EPHEDRINE SULFATE 50 MG/ML IJ SOLN
INTRAMUSCULAR | Status: DC | PRN
  Administered 2017-10-09 (×2): 5 mg via INTRAVENOUS

## 2017-10-09 MED ORDER — MIDAZOLAM HCL 5 MG/5ML IJ SOLN
INTRAMUSCULAR | Status: DC | PRN
  Administered 2017-10-09 (×2): 1 mg via INTRAVENOUS

## 2017-10-09 MED ORDER — 0.9 % SODIUM CHLORIDE (POUR BTL) OPTIME
TOPICAL | Status: DC | PRN
  Administered 2017-10-09: 2000 mL

## 2017-10-09 MED ORDER — MIDAZOLAM HCL 2 MG/2ML IJ SOLN
INTRAMUSCULAR | Status: AC
  Administered 2017-10-09: 2 mg via INTRAVENOUS
  Filled 2017-10-09: qty 2

## 2017-10-09 MED ORDER — FENTANYL CITRATE (PF) 100 MCG/2ML IJ SOLN
100.0000 ug | Freq: Once | INTRAMUSCULAR | Status: AC
  Administered 2017-10-09: 100 ug via INTRAVENOUS

## 2017-10-09 MED ORDER — PROPOFOL 10 MG/ML IV BOLUS
INTRAVENOUS | Status: AC
  Filled 2017-10-09: qty 20

## 2017-10-09 MED ORDER — CEFAZOLIN SODIUM-DEXTROSE 2-4 GM/100ML-% IV SOLN
INTRAVENOUS | Status: AC
  Filled 2017-10-09: qty 100

## 2017-10-09 MED ORDER — ONDANSETRON HCL 4 MG/2ML IJ SOLN: 4.0000 mg | Freq: Once | INTRAMUSCULAR | Status: DC | PRN

## 2017-10-09 MED ORDER — MEPERIDINE HCL 50 MG/ML IJ SOLN: 6.2500 mg | INTRAMUSCULAR | Status: DC | PRN

## 2017-10-09 MED ORDER — CEFAZOLIN SODIUM-DEXTROSE 2-4 GM/100ML-% IV SOLN
2.0000 g | INTRAVENOUS | Status: AC
  Administered 2017-10-09: 2 g via INTRAVENOUS

## 2017-10-09 MED ORDER — OXYCODONE HCL 5 MG PO TABS: 5.0000 mg | ORAL_TABLET | Freq: Four times a day (QID) | ORAL | 0 refills | Status: DC | PRN

## 2017-10-09 MED ORDER — HYDROMORPHONE HCL 1 MG/ML IJ SOLN
INTRAMUSCULAR | Status: AC
  Filled 2017-10-09: qty 1

## 2017-10-09 MED ORDER — PROPOFOL 10 MG/ML IV BOLUS
INTRAVENOUS | Status: DC | PRN
  Administered 2017-10-09: 150 mg via INTRAVENOUS

## 2017-10-09 MED ORDER — MIDAZOLAM HCL 2 MG/2ML IJ SOLN
2.0000 mg | Freq: Once | INTRAMUSCULAR | Status: AC
  Administered 2017-10-09: 2 mg via INTRAVENOUS

## 2017-10-09 MED ORDER — LACTATED RINGERS IV SOLN
INTRAVENOUS | Status: DC | PRN
  Administered 2017-10-09: 13:00:00 via INTRAVENOUS

## 2017-10-09 MED ORDER — BUPIVACAINE-EPINEPHRINE (PF) 0.5% -1:200000 IJ SOLN
INTRAMUSCULAR | Status: DC | PRN
  Administered 2017-10-09: 30 mL

## 2017-10-09 MED ORDER — ONDANSETRON HCL 4 MG/2ML IJ SOLN
INTRAMUSCULAR | Status: DC | PRN
  Administered 2017-10-09: 4 mg via INTRAVENOUS

## 2017-10-09 MED ORDER — FENTANYL CITRATE (PF) 250 MCG/5ML IJ SOLN
INTRAMUSCULAR | Status: DC | PRN
  Administered 2017-10-09: 25 ug via INTRAVENOUS
  Administered 2017-10-09: 50 ug via INTRAVENOUS

## 2017-10-09 MED ORDER — BUPIVACAINE HCL (PF) 0.25 % IJ SOLN
INTRAMUSCULAR | Status: AC
Start: 2017-10-09 — End: ?
  Filled 2017-10-09: qty 30

## 2017-10-09 MED ORDER — HYDROMORPHONE HCL 1 MG/ML IJ SOLN
0.2500 mg | INTRAMUSCULAR | Status: DC | PRN
  Administered 2017-10-09: 0.5 mg via INTRAVENOUS

## 2017-10-09 MED ORDER — LIDOCAINE HCL (CARDIAC) 20 MG/ML IV SOLN
INTRAVENOUS | Status: DC | PRN
  Administered 2017-10-09: 100 mg via INTRAVENOUS

## 2017-10-09 SURGICAL SUPPLY — 48 items
ADH SKN CLS APL DERMABOND .7 (GAUZE/BANDAGES/DRESSINGS) ×1
BLADE CLIPPER SURG (BLADE) IMPLANT
BLADE SURG 10 STRL SS (BLADE) ×2 IMPLANT
BLADE SURG 15 STRL LF DISP TIS (BLADE) ×1 IMPLANT
BLADE SURG 15 STRL SS (BLADE) ×2
CANISTER SUCT 3000ML PPV (MISCELLANEOUS) IMPLANT
CHLORAPREP W/TINT 26ML (MISCELLANEOUS) ×2 IMPLANT
COVER MAYO STAND STRL (DRAPES) ×1 IMPLANT
COVER SURGICAL LIGHT HANDLE (MISCELLANEOUS) ×2 IMPLANT
DERMABOND ADVANCED (GAUZE/BANDAGES/DRESSINGS) ×1
DERMABOND ADVANCED .7 DNX12 (GAUZE/BANDAGES/DRESSINGS) ×1 IMPLANT
DRAIN PENROSE 1/2X12 LTX STRL (WOUND CARE) IMPLANT
DRAPE LAPAROTOMY 100X72 PEDS (DRAPES) ×2 IMPLANT
DRAPE UTILITY XL STRL (DRAPES) ×2 IMPLANT
ELECT CAUTERY BLADE 6.4 (BLADE) ×2 IMPLANT
ELECT REM PT RETURN 9FT ADLT (ELECTROSURGICAL) ×2
ELECTRODE REM PT RTRN 9FT ADLT (ELECTROSURGICAL) ×1 IMPLANT
GLOVE BIO SURGEON STRL SZ8 (GLOVE) ×2 IMPLANT
GLOVE BIOGEL PI IND STRL 8 (GLOVE) ×1 IMPLANT
GLOVE BIOGEL PI INDICATOR 8 (GLOVE) ×1
GOWN STRL REUS W/ TWL LRG LVL3 (GOWN DISPOSABLE) ×1 IMPLANT
GOWN STRL REUS W/ TWL XL LVL3 (GOWN DISPOSABLE) ×1 IMPLANT
GOWN STRL REUS W/TWL LRG LVL3 (GOWN DISPOSABLE) ×2
GOWN STRL REUS W/TWL XL LVL3 (GOWN DISPOSABLE) ×2
KIT BASIN OR (CUSTOM PROCEDURE TRAY) ×2 IMPLANT
KIT ROOM TURNOVER OR (KITS) ×2 IMPLANT
MESH BARD SOFT 3X6IN (Mesh General) ×1 IMPLANT
NEEDLE 22X1 1/2 (OR ONLY) (NEEDLE) ×2 IMPLANT
NS IRRIG 1000ML POUR BTL (IV SOLUTION) ×2 IMPLANT
PACK SURGICAL SETUP 50X90 (CUSTOM PROCEDURE TRAY) ×2 IMPLANT
PAD ARMBOARD 7.5X6 YLW CONV (MISCELLANEOUS) ×2 IMPLANT
PENCIL BUTTON HOLSTER BLD 10FT (ELECTRODE) ×2 IMPLANT
SPECIMEN JAR SMALL (MISCELLANEOUS) IMPLANT
SPONGE LAP 18X18 X RAY DECT (DISPOSABLE) ×2 IMPLANT
SUT MNCRL AB 4-0 PS2 18 (SUTURE) ×2 IMPLANT
SUT PROLENE 2 0 CT2 30 (SUTURE) ×6 IMPLANT
SUT VIC AB 2-0 SH 27 (SUTURE) ×4
SUT VIC AB 2-0 SH 27X BRD (SUTURE) ×1 IMPLANT
SUT VIC AB 2-0 SH 27XBRD (SUTURE) IMPLANT
SUT VIC AB 3-0 SH 27 (SUTURE) ×2
SUT VIC AB 3-0 SH 27X BRD (SUTURE) ×1 IMPLANT
SUT VICRYL AB 3 0 TIES (SUTURE) IMPLANT
SYR BULB 3OZ (MISCELLANEOUS) ×2 IMPLANT
SYR CONTROL 10ML LL (SYRINGE) ×2 IMPLANT
TOWEL OR 17X24 6PK STRL BLUE (TOWEL DISPOSABLE) ×2 IMPLANT
TOWEL OR 17X26 10 PK STRL BLUE (TOWEL DISPOSABLE) ×2 IMPLANT
TUBE CONNECTING 12X1/4 (SUCTIONS) ×1 IMPLANT
YANKAUER SUCT BULB TIP NO VENT (SUCTIONS) ×1 IMPLANT

## 2017-10-09 NOTE — Interval H&P Note (Signed)
History and Physical Interval Note:  10/09/2017 12:20 PM  John Page  has presented today for surgery, with the diagnosis of Left Inguinal Hernia  The various methods of treatment have been discussed with the patient and family. After consideration of risks, benefits and other options for treatment, the patient has consented to  Procedure(s): LEFT INGUINAL HERNIA REPAIR (Left) INSERTION OF MESH (Left) as a surgical intervention .  The patient's history has been reviewed, patient examined, no change in status, stable for surgery.  I have reviewed the patient's chart and labs.  Questions were answered to the patient's satisfaction.     Zenovia Jarred

## 2017-10-09 NOTE — H&P (Signed)
John Page Documented: 09/20/2017 10:37 AM Location: [facility] Patient #: [id number] DOB: 04/25/1956 Single / Language: Cleophus Molt / Race: White Male   History of Present Illness Lavone Neri E. Grandville Silos MD; 09/20/2017 10:51 AM) The patient is a 62 year old male who presents with an inguinal hernia. I was asked to see Serafino in consultation by Dr. Alyson Ingles in regards to a left inguinal hernia. He developed some testicle pain recently after vaccination. He initially thought it was due to the period within a couple weeks, he had his annual physical and at that time, Dr. Alyson Ingles found a left inguinal hernia. Dimitrius notices it pop in and out. He usually goes away when he lays down at night. He has had some associated discomfort in his testicle when it is bothering him. No severe pain. No change in bowel habits or bladder habits.   Past Surgical History Benjiman Core, [place]; 09/20/2017 10:38 AM) Tonsillectomy   Diagnostic Studies History Benjiman Core, CMA; 09/20/2017 10:38 AM) Colonoscopy  1-5 years ago  Allergies Benjiman Core, CMA; 09/20/2017 10:39 AM) No Known Drug Allergies [09/20/2017]:  Medication History Benjiman Core, CMA; 09/20/2017 10:40 AM) Fish Oil Active. Medications Reconciled  Social History Benjiman Core, CMA; 09/20/2017 10:38 AM) Alcohol use  Occasional alcohol use. Caffeine use  Coffee. No drug use  Tobacco use  Former smoker.  Family History Benjiman Core, [place]; 09/20/2017 10:38 AM) Heart Disease  Mother. Hypertension  Brother. Respiratory Condition  Father.  Other Problems Benjiman Core, CMA; 09/20/2017 10:38 AM) Heart murmur  Hemorrhoids  Hypercholesterolemia  Melanoma     Review of Systems (Armen Glenn CMA; 09/20/2017 10:38 AM) General Not Present- Appetite Loss, Chills, Fatigue, Fever, Night Sweats, Weight Gain and Weight Loss. Skin Not Present- Change in Wart/Mole, Dryness, Hives, Jaundice, New Lesions, Non-Healing Wounds, Rash and Ulcer. HEENT Present-  Wears glasses/contact lenses. Not Present- Earache, Hearing Loss, Hoarseness, Nose Bleed, Oral Ulcers, Ringing in the Ears, Seasonal Allergies, Sinus Pain, Sore Throat, Visual Disturbances and Yellow Eyes. Respiratory Present- Snoring. Not Present- Bloody sputum, Chronic Cough, Difficulty Breathing and Wheezing. Breast Not Present- Breast Mass, Breast Pain, Nipple Discharge and Skin Changes. Cardiovascular Not Present- Chest Pain, Difficulty Breathing Lying Down, Leg Cramps, Palpitations, Rapid Heart Rate, Shortness of Breath and Swelling of Extremities. Gastrointestinal Not Present- Abdominal Pain, Bloating, Bloody Stool, Change in Bowel Habits, Chronic diarrhea, Constipation, Difficulty Swallowing, Excessive gas, Gets full quickly at meals, Hemorrhoids, Indigestion, Nausea, Rectal Pain and Vomiting. Male Genitourinary Not Present- Blood in Urine, Change in Urinary Stream, Frequency, Impotence, Nocturia, Painful Urination, Urgency and Urine Leakage. Musculoskeletal Not Present- Back Pain, Joint Pain, Joint Stiffness, Muscle Pain, Muscle Weakness and Swelling of Extremities. Neurological Not Present- Decreased Memory, Fainting, Headaches, Numbness, Seizures, Tingling, Tremor, Trouble walking and Weakness. Psychiatric Not Present- Anxiety, Bipolar, Change in Sleep Pattern, Depression, Fearful and Frequent crying. Endocrine Not Present- Cold Intolerance, Excessive Hunger, Hair Changes, Heat Intolerance, Hot flashes and New Diabetes. Hematology Not Present- Blood Thinners, Easy Bruising, Excessive bleeding, Gland problems, HIV and Persistent Infections.  Vitals (Armen Glenn CMA; 09/20/2017 10:39 AM) 09/20/2017 10:38 AM Weight: 159.25 lb Height: 70in Body Surface Area: 1.89 m Body Mass Index: 22.85 kg/m  Temp.: 97.36F  Pulse: 64 (Regular)  P.OX: 96% (Room air) BP: 156/86 (Sitting, Left Arm, Standard)       Physical Exam Lavone Neri E. Grandville Silos MD; 09/20/2017 10:52 AM) General Mental  Status-Alert. General Appearance-Consistent with stated age. Hydration-Well hydrated. Voice-Normal.  Head and Neck Head-normocephalic, atraumatic with no lesions or palpable  masses.  Eye Eyeball - Bilateral-Extraocular movements intact. Sclera/Conjunctiva - Bilateral-No scleral icterus.  Chest and Lung Exam Chest and lung exam reveals -quiet, even and easy respiratory effort with no use of accessory muscles and on auscultation, normal breath sounds, no adventitious sounds and normal vocal resonance. Inspection Chest Wall - Normal. Back - normal.  Cardiovascular Cardiovascular examination reveals -on palpation PMI is normal in location and amplitude, no palpable S3 or S4. Normal cardiac borders., carotid auscultation reveals no bruits and normal pedal pulses bilaterally. Auscultation Murmurs & Other Heart Sounds - Auscultation of the heart reveals - Note: 2/6 murmur.  Abdomen Inspection Inspection of the abdomen reveals - No Hernias. Skin - Scar - no surgical scars. Palpation/Percussion Palpation and Percussion of the abdomen reveal - Soft, Non Tender, No Rebound tenderness, No Rigidity (guarding) and No hepatosplenomegaly. Auscultation Auscultation of the abdomen reveals - Bowel sounds normal.  Male Genitourinary Note: Both testes are descended, no evidence of right inguinal hernia, small left inguinal hernia is present and does not extend down into the scrotum, it easily reduces   Neurologic Neurologic evaluation reveals -alert and oriented x 3 with no impairment of recent or remote memory. Mental Status-Normal.  Musculoskeletal Normal Exam - Left-Upper Extremity Strength Normal and Lower Extremity Strength Normal. Normal Exam - Right-Upper Extremity Strength Normal, Lower Extremity Weakness.    Assessment & Plan Lavone Neri E. Grandville Silos MD; 09/20/2017 10:55 AM) INGUINAL HERNIA OF LEFT SIDE WITHOUT OBSTRUCTION OR GANGRENE (K40.90) Impression:  Operative hernia repair with mesh. I discussed the procedure, risks, and benefits. I offered an open repair. I discussed the expected postoperative course and the need to avoid heavy lifting for 6 weeks postoperatively. He would like to think about undergoing surgery and he will give [place] a call and talk to the schedulers at that time. I answered his questions. Current Plans Pt Education - Pamphlet Given - Hernia Surgery: discussed with patient and provided information. MITRAL VALVE PROLAPSE (I34.1) Impression: He is followed by Dr. Irish Lack. If he decides to proceed with surgery, he will need preoperative clearance. I reviewed his last cardiology note and I do not think this will be an issue.  3/25 no changes from previous exam. Cardiology cleared him. TAP block by anesthesia appreciated.  Georganna Skeans, MD, MPH, FACS Trauma: [phone] General Surgery: [phone]

## 2017-10-09 NOTE — Op Note (Signed)
10/09/2017  1:59 PM  PATIENT:  John Page  62 y.o. male  PRE-OPERATIVE DIAGNOSIS:  Left Inguinal Hernia  POST-OPERATIVE DIAGNOSIS:  Left Inguinal Hernia  PROCEDURE:  Procedure(s): LEFT INGUINAL HERNIA REPAIR INSERTION OF MESH  SURGEON:  Surgeon(s): Georganna Skeans, MD  ASSISTANTS: none   ANESTHESIA:   local, regional and general  EBL:  Total I/O In: 700 [I.V.:700] Out: 15 [Blood:15]  BLOOD ADMINISTERED:none  DRAINS: none   SPECIMEN:  No Specimen  DISPOSITION OF SPECIMEN:  N/A  COUNTS:  YES  DICTATION: .Dragon Dictation Findings: Indirect hernia reduced easily   procedure in detail: John Page presents for repair of left inguinal hernia with mesh.  He was identified the preop holding area.  Informed consent was obtained.  Anesthesia placed a TAP block.  He received intravenous antibiotics.  He was brought to the operating room and general  anesthesia was administered by the anesthesia staff.  His lower abdomen and groins were prepped and draped in sterile fashion.  Timeout procedure was performed.  Local was injected along the left groin planned line of incision.  Left groin incision was made.  Subtenons tissues were dissected down through Scarpa's fascia revealing the external oblique.  This was divided laterally and the division laterally was extended medially down through the external ring.  The superior leaflet of the external oblique was dissected free off the transversalis.  The inferior leaflet was dissected down revealing the shelving edge of the inguinal ligament.  The cord was encircled with a Penrose drain.  Dissection of the cord revealed his hernia was a direct hernia.  This was dissected free from the cord structures and it reduced easily.  Inspection of the cord revealed no evidence of an indirect hernia.  Several interrupted 2-0 Vicryl sutures were placed between the transversalis and the shelving edge of inguinal ligament to hold the hernia reduced to allow  completion of the repair.  The repair was completed with a keyhole polypropylene mesh which was cut to custom size and shape.  It was tacked with 0 Prolene to the tissues of the pubic tubercle inferiorly and then in a running fashion inferiorly the mesh was sewed to the shelving edge of the inguinal ligament.  Next, superiorly, the mesh was tacked down with 0 Prolene in an interrupted fashion to the tissue over the pubic tubercle and then out along the transversalis.  The 2 leaflets of the mesh were rejoined behind the cord structures and tacked down to the underlying muscular tissue with 0 Prolene.  The aperture in the mesh was adjusted so it admitted the tip of 1/5 digit next to the cord.  The cord remained viable and nonedematous.  The wound was irrigated and hemostasis was insured.  The external oblique was closed with running 2-0 Vicryl.  Scarpa's fascia was approximated with interrupted 3-0 Vicryl.  Skin was closed with running 4-0 Monocryl all of by Dermabond.  All counts were correct.  He tolerated the procedure well without apparent complication.  His left testicle was relocated to anatomic position in the scrotum.  He was taken recovery in stable condition. PATIENT DISPOSITION:  PACU - hemodynamically stable.   Delay start of Pharmacological VTE agent (>24hrs) due to surgical blood loss or risk of bleeding:  no  Georganna Skeans, MD, MPH, FACS Pager: [phone]  3/25/20191:59 PM

## 2017-10-09 NOTE — Anesthesia Postprocedure Evaluation (Signed)
Anesthesia Post Note  Patient: John Page  Procedure(s) Performed: LEFT INGUINAL HERNIA REPAIR (Left ) INSERTION OF MESH (Left )     Patient location during evaluation: PACU Anesthesia Type: General Level of consciousness: awake and alert Pain management: pain level controlled Vital Signs Assessment: post-procedure vital signs reviewed and stable Respiratory status: spontaneous breathing, nonlabored ventilation, respiratory function stable and patient connected to nasal cannula oxygen Cardiovascular status: blood pressure returned to baseline and stable Postop Assessment: no apparent nausea or vomiting Anesthetic complications: no    Last Vitals:  Vitals:   10/09/17 1542 10/09/17 1545  BP: (!) 145/88   Pulse: 68 77  Resp: 10 13  Temp:    SpO2: 95% 95%    Last Pain:  Vitals:   10/09/17 1445  TempSrc:   PainSc: Asleep                 Cheyann Blecha DAVID

## 2017-10-09 NOTE — Transfer of Care (Signed)
Immediate Anesthesia Transfer of Care Note  Patient: John Page  Procedure(s) Performed: LEFT INGUINAL HERNIA REPAIR (Left ) INSERTION OF MESH (Left )  Patient Location: PACU  Anesthesia Type:General  Level of Consciousness: awake, alert , patient cooperative and responds to stimulation  Airway & Oxygen Therapy: Patient Spontanous Breathing and Patient connected to face mask oxygen  Post-op Assessment: Report given to RN, Post -op Vital signs reviewed and stable and Patient moving all extremities X 4  Post vital signs: Reviewed and stable  Last Vitals:  Vitals Value Taken Time  BP 130/77 10/09/2017  2:12 PM  Temp    Pulse 80 10/09/2017  2:13 PM  Resp 16 10/09/2017  2:13 PM  SpO2 100 % 10/09/2017  2:13 PM  Vitals shown include unvalidated device data.  Last Pain:  Vitals:   10/09/17 0945  TempSrc:   PainSc: 0-No pain      Patients Stated Pain Goal: 3 (00/86/76 1950)  Complications: No apparent anesthesia complications

## 2017-10-09 NOTE — Anesthesia Procedure Notes (Signed)
Procedure Name: LMA Insertion Date/Time: 10/09/2017 1:12 PM Performed by: Glynda Jaeger, CRNA Pre-anesthesia Checklist: Patient identified, Emergency Drugs available, Suction available, Timeout performed and Patient being monitored Patient Re-evaluated:Patient Re-evaluated prior to induction Oxygen Delivery Method: Circle system utilized Preoxygenation: Pre-oxygenation with 100% oxygen Induction Type: IV induction Ventilation: Mask ventilation without difficulty LMA: LMA inserted LMA Size: 4.0 Number of attempts: 1 Tube secured with: Tape Dental Injury: Teeth and Oropharynx as per pre-operative assessment

## 2017-10-09 NOTE — Anesthesia Procedure Notes (Signed)
Anesthesia Regional Block: TAP block   Pre-Anesthetic Checklist: ,, timeout performed, Correct Patient, Correct Site, Correct Laterality, Correct Procedure, Correct Position, site marked, Risks and benefits discussed,  Surgical consent,  Pre-op evaluation,  At surgeon's request and post-op pain management  Laterality: Left  Prep: chloraprep       Needles:  Injection technique: Single-shot  Needle Type: Echogenic Stimulator Needle      Needle Gauge: 21     Additional Needles:   Narrative:  Start time: 10/09/2017 9:50 AM End time: 10/09/2017 10:00 AM Injection made incrementally with aspirations every 5 mL.  Performed by: Personally   Additional Notes: Monitors applied. Patient sedated. Sterile prep and drape,hand hygiene and sterile gloves were used. Relevant anatomy identified.Needle position confirmed.Local anesthetic injected incrementally after negative aspiration. Local anesthetic spread visualized in Transversus Abdominus Plane. Vascular puncture avoided. No complications. Image printed for medical record.The patient tolerated the procedure well.

## 2017-10-10 ENCOUNTER — Encounter (HOSPITAL_COMMUNITY): Payer: Self-pay | Admitting: General Surgery

## 2017-12-21 ENCOUNTER — Other Ambulatory Visit: Payer: Self-pay | Admitting: Interventional Cardiology

## 2018-04-27 ENCOUNTER — Encounter: Payer: Self-pay | Admitting: Interventional Cardiology

## 2018-08-06 ENCOUNTER — Ambulatory Visit (HOSPITAL_COMMUNITY): Payer: 59 | Attending: Cardiology

## 2018-08-06 ENCOUNTER — Other Ambulatory Visit: Payer: Self-pay

## 2018-08-06 DIAGNOSIS — I340 Nonrheumatic mitral (valve) insufficiency: Secondary | ICD-10-CM | POA: Diagnosis not present

## 2018-08-15 NOTE — H&P (View-Only) (Signed)
Cardiology Office Note   Date:  08/16/2018   ID:  ZED WANNINGER, DOB 06/19/1956, MRN [id number]  PCP:  Maury Dus, MD    No chief complaint on file.  Mitral regurgitation  Wt Readings from Last 3 Encounters:  08/16/18 158 lb (71.7 kg)  10/09/17 161 lb (73 kg)  10/03/17 161 lb 1.6 oz (73.1 kg)       History of Present Illness: John Page is a 63 y.o. male  who had a new heart murmur found on exam in 2013. He had an echo showing MVP with moderate MR.  History of melanoma requiring surgery.    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 MR difficult to quantitate due to eccentricity of jet that is directed anteriorly and toward the septum. Unable to perfom accurate assessment of MR with PISA. Consider TEE for better assessment. Mildly dilated ascending aorta.  No change in LV size.  Normal LA size.    He had a mumps vaccine and this caused some testicular pain.   In 2020, echo showed: Left ventricle: The cavity size was normal. Wall thickness was   normal. Systolic function was normal. The estimated ejection   fraction was in the range of 60% to 65%. Wall motion was normal;   there were no regional wall motion abnormalities. Features are   consistent with a pseudonormal left ventricular filling pattern,   with concomitant abnormal relaxation and increased filling   pressure (grade 2 diastolic dysfunction). Doppler parameters are   consistent with high ventricular filling pressure. - Ascending aorta: The ascending aorta was mildly dilated. - Mitral valve: Calcified annulus. Mildly thickened leaflets .   Severe prolapse, involving the posterior leaflet. There was   severe regurgitation directed eccentrically and anteriorly. - Left atrium: The atrium was mildly dilated.  Impressions:  - Normal LV systolic function; moderate diastolic dysfunction;   mildly dilated ascending aorta; probable flail posterior MV  leaflet with severe eccentric MR; mild LAE.  He reports some mild SHOB with lying flat.    He started back on his statin about 6 weeks ago.    Denies : Chest pain. Dizziness. Leg edema. Nitroglycerin use. Palpitations. Paroxysmal nocturnal dyspnea.  Syncope.     Past Medical History:  Diagnosis Date  . Cancer ([place])    melanoma  . Heart murmur   . Mitral valve disorders(424.0)    severe MVP by 07/25/17 echo  . Other and unspecified hyperlipidemia     Past Surgical History:  Procedure Laterality Date  . INGUINAL HERNIA REPAIR Left 10/09/2017   Procedure: LEFT INGUINAL HERNIA REPAIR;  Surgeon: Georganna Skeans, MD;  Location: [facility];  Service: General;  Laterality: Left;  . INSERTION OF MESH Left 10/09/2017   Procedure: INSERTION OF MESH;  Surgeon: Georganna Skeans, MD;  Location: [facility];  Service: General;  Laterality: Left;  Marland Kitchen MELANOMA EXCISION    . TONSILLECTOMY       Current Outpatient Medications  Medication Sig Dispense Refill  . acetaminophen (TYLENOL) 325 MG tablet Take 650 mg by mouth every 6 (six) hours as needed (for pain.).    Marland Kitchen amoxicillin (AMOXIL) 500 MG capsule 4 CAPSULES 1 HOUR PRIOR TO DENTAL 4 capsule 3  . pravastatin (PRAVACHOL) 40 MG tablet Take 40 mg by mouth daily.     No current facility-administered medications for this visit.     Allergies:   Patient has no known allergies.    Social History:  The patient  reports that he quit smoking about 30 years ago. He has never used smokeless tobacco. He reports that he does not use drugs.   Family History:  The patient's family history includes Heart attack in his mother; Heart disease in his mother; Hypertension in his brother and mother; Lung cancer in his father.    ROS:  Please see the history of present illness.   Otherwise, review of systems are positive for mild shortness of breath.   All other systems are reviewed and negative.    PHYSICAL EXAM: VS:  BP 134/86   Pulse 65   Ht 5\' 10"  (1.778 m)   Wt  158 lb (71.7 kg)   SpO2 97%   BMI 22.67 kg/m  , BMI Body mass index is 22.67 kg/m. GEN: Well nourished, well developed, in no acute distress  HEENT: normal  Neck: no JVD, carotid bruits, or masses Cardiac: RRR; 3/6 late peaking systolic murmur, no rubs, or gallops,no edema  Respiratory:  clear to auscultation bilaterally, normal work of breathing GI: soft, nontender, nondistended, + BS MS: no deformity or atrophy  Skin: warm and dry, no rash Neuro:  Strength and sensation are intact Psych: euthymic mood, full affect   EKG:   The ekg ordered today demonstrates NSR, biatrial enlargement, no ST changes   Recent Labs: 10/03/2017: BUN 16; Creatinine, Ser 1.25; Hemoglobin 14.7; Platelets 229; Potassium 4.5; Sodium 139   Lipid Panel No results found for: CHOL, TRIG, HDL, CHOLHDL, VLDL, LDLCALC, LDLDIRECT   Other studies Reviewed: Additional studies/ records that were reviewed today with results demonstrating: echo reviewed.   ASSESSMENT AND PLAN:  1. Mitral valve regurgitation/MVP:  I think at this time, he should have his MV repaired.  Risks and benefits of TEE and cath explained.  He is agreeable.  All questions answered. 2. Elevated cholesterol: Started a statin. 3. Shortness of breath: May be the beginning of some volume overloa/CHF sx. 4.  Cardiac catheterization was discussed with the patient fully. The patient understands that risks include but are not limited to stroke (1 in 1000), death (1 in 38), kidney failure [usually temporary] (1 in 500), bleeding (1 in 200), allergic reaction [possibly serious] (1 in 200).  The patient understands and is willing to proceed.      Current medicines are reviewed at length with the patient today.  The patient concerns regarding his medicines were addressed.  The following changes have been made:  No change  Labs/ tests ordered today include:   Orders Placed This Encounter  Procedures  . Basic metabolic panel  . CBC  .  Ambulatory referral to Cardiothoracic Surgery  . EKG 12-Lead    Recommend 150 minutes/week of aerobic exercise Low fat, low carb, high fiber diet recommended  Disposition:   FU for cath   Signed, Larae Grooms, MD  08/16/2018 11:34 AM    [facility] [address], [place], [place]  [address] Phone: [phone]; Fax: [phone]

## 2018-08-15 NOTE — Progress Notes (Signed)
Cardiology Office Note   Date:  08/16/2018   ID:  John Page, DOB [birth year], MRN [id number]  PCP:  Maury Dus, MD    No chief complaint on file.  Mitral regurgitation  Wt Readings from Last 3 Encounters:  08/16/18 158 lb (71.7 kg)  10/09/17 161 lb (73 kg)  10/03/17 161 lb 1.6 oz (73.1 kg)       History of Present Illness: John Page is a 63 y.o. male  who had a new heart murmur found on exam in 2013. He had an echo showing MVP with moderate MR.  History of melanoma requiring surgery.    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 MR difficult to quantitate due to eccentricity of jet that is directed anteriorly and toward the septum. Unable to perfom accurate assessment of MR with PISA. Consider TEE for better assessment. Mildly dilated ascending aorta.  No change in LV size.  Normal LA size.    He had a mumps vaccine and this caused some testicular pain.   In 2020, echo showed: Left ventricle: The cavity size was normal. Wall thickness was   normal. Systolic function was normal. The estimated ejection   fraction was in the range of 60% to 65%. Wall motion was normal;   there were no regional wall motion abnormalities. Features are   consistent with a pseudonormal left ventricular filling pattern,   with concomitant abnormal relaxation and increased filling   pressure (grade 2 diastolic dysfunction). Doppler parameters are   consistent with high ventricular filling pressure. - Ascending aorta: The ascending aorta was mildly dilated. - Mitral valve: Calcified annulus. Mildly thickened leaflets .   Severe prolapse, involving the posterior leaflet. There was   severe regurgitation directed eccentrically and anteriorly. - Left atrium: The atrium was mildly dilated.  Impressions:  - Normal LV systolic function; moderate diastolic dysfunction;   mildly dilated ascending aorta; probable flail posterior MV  leaflet with severe eccentric MR; mild LAE.  He reports some mild SHOB with lying flat.    He started back on his statin about 6 weeks ago.    Denies : Chest pain. Dizziness. Leg edema. Nitroglycerin use. Palpitations. Paroxysmal nocturnal dyspnea.  Syncope.     Past Medical History:  Diagnosis Date  . Cancer ([place])    melanoma  . Heart murmur   . Mitral valve disorders(424.0)    severe MVP by 07/25/17 echo  . Other and unspecified hyperlipidemia     Past Surgical History:  Procedure Laterality Date  . INGUINAL HERNIA REPAIR Left 10/09/2017   Procedure: LEFT INGUINAL HERNIA REPAIR;  Surgeon: Georganna Skeans, MD;  Location: [facility];  Service: General;  Laterality: Left;  . INSERTION OF MESH Left 10/09/2017   Procedure: INSERTION OF MESH;  Surgeon: Georganna Skeans, MD;  Location: [facility];  Service: General;  Laterality: Left;  Marland Kitchen MELANOMA EXCISION    . TONSILLECTOMY       Current Outpatient Medications  Medication Sig Dispense Refill  . acetaminophen (TYLENOL) 325 MG tablet Take 650 mg by mouth every 6 (six) hours as needed (for pain.).    Marland Kitchen amoxicillin (AMOXIL) 500 MG capsule 4 CAPSULES 1 HOUR PRIOR TO DENTAL 4 capsule 3  . pravastatin (PRAVACHOL) 40 MG tablet Take 40 mg by mouth daily.     No current facility-administered medications for this visit.     Allergies:   Patient has no known allergies.    Social History:  The patient  reports that he quit smoking about 30 years ago. He has never used smokeless tobacco. He reports that he does not use drugs.   Family History:  The patient's family history includes Heart attack in his mother; Heart disease in his mother; Hypertension in his brother and mother; Lung cancer in his father.    ROS:  Please see the history of present illness.   Otherwise, review of systems are positive for mild shortness of breath.   All other systems are reviewed and negative.    PHYSICAL EXAM: VS:  BP 134/86   Pulse 65   Ht 5\' 10"  (1.778 m)   Wt  158 lb (71.7 kg)   SpO2 97%   BMI 22.67 kg/m  , BMI Body mass index is 22.67 kg/m. GEN: Well nourished, well developed, in no acute distress  HEENT: normal  Neck: no JVD, carotid bruits, or masses Cardiac: RRR; 3/6 late peaking systolic murmur, no rubs, or gallops,no edema  Respiratory:  clear to auscultation bilaterally, normal work of breathing GI: soft, nontender, nondistended, + BS MS: no deformity or atrophy  Skin: warm and dry, no rash Neuro:  Strength and sensation are intact Psych: euthymic mood, full affect   EKG:   The ekg ordered today demonstrates NSR, biatrial enlargement, no ST changes   Recent Labs: 10/03/2017: BUN 16; Creatinine, Ser 1.25; Hemoglobin 14.7; Platelets 229; Potassium 4.5; Sodium 139   Lipid Panel No results found for: CHOL, TRIG, HDL, CHOLHDL, VLDL, LDLCALC, LDLDIRECT   Other studies Reviewed: Additional studies/ records that were reviewed today with results demonstrating: echo reviewed.   ASSESSMENT AND PLAN:  1. Mitral valve regurgitation/MVP:  I think at this time, he should have his MV repaired.  Risks and benefits of TEE and cath explained.  He is agreeable.  All questions answered. 2. Elevated cholesterol: Started a statin. 3. Shortness of breath: May be the beginning of some volume overloa/CHF sx. 4.  Cardiac catheterization was discussed with the patient fully. The patient understands that risks include but are not limited to stroke (1 in 1000), death (1 in 83), kidney failure [usually temporary] (1 in 500), bleeding (1 in 200), allergic reaction [possibly serious] (1 in 200).  The patient understands and is willing to proceed.      Current medicines are reviewed at length with the patient today.  The patient concerns regarding his medicines were addressed.  The following changes have been made:  No change  Labs/ tests ordered today include:   Orders Placed This Encounter  Procedures  . Basic metabolic panel  . CBC  .  Ambulatory referral to Cardiothoracic Surgery  . EKG 12-Lead    Recommend 150 minutes/week of aerobic exercise Low fat, low carb, high fiber diet recommended  Disposition:   FU for cath   Signed, Larae Grooms, MD  08/16/2018 11:34 AM    [facility] [place], [place], [place]  [address] Phone: [phone]; Fax: [phone]

## 2018-08-16 ENCOUNTER — Encounter: Payer: Self-pay | Admitting: Interventional Cardiology

## 2018-08-16 ENCOUNTER — Ambulatory Visit: Payer: 59 | Admitting: Interventional Cardiology

## 2018-08-16 VITALS — BP 134/86 | HR 65 | Ht 70.0 in | Wt 158.0 lb

## 2018-08-16 DIAGNOSIS — R0602 Shortness of breath: Secondary | ICD-10-CM | POA: Diagnosis not present

## 2018-08-16 DIAGNOSIS — I340 Nonrheumatic mitral (valve) insufficiency: Secondary | ICD-10-CM | POA: Diagnosis not present

## 2018-08-16 DIAGNOSIS — I341 Nonrheumatic mitral (valve) prolapse: Secondary | ICD-10-CM

## 2018-08-16 DIAGNOSIS — E782 Mixed hyperlipidemia: Secondary | ICD-10-CM

## 2018-08-16 NOTE — Patient Instructions (Addendum)
Medication Instructions:  Your physician recommends that you continue on your current medications as directed. Please refer to the Current Medication list given to you today.  If you need a refill on your cardiac medications before your next appointment, please call your pharmacy.   Lab work: TODAY: CBC, BMET  If you have labs (blood work) drawn today and your tests are completely normal, you will receive your results only by: Marland Kitchen MyChart Message (if you have MyChart) OR . A paper copy in the mail If you have any lab test that is abnormal or we need to change your treatment, we will call you to review the results.  Testing/Procedures: Your physician has requested that you have a TEE on 08/24/18. During a TEE, sound waves are used to create images of your heart. It provides your doctor with information about the size and shape of your heart and how well your heart's chambers and valves are working. In this test, a transducer is attached to the end of a flexible tube that's guided down your throat and into your esophagus (the tube leading from you mouth to your stomach) to get a more detailed image of your heart. You are not awake for the procedure. Please see the instruction sheet given to you today. For further information please visit [email or web address].  Your physician has requested that you have a cardiac catheterization on 08/24/18. Cardiac catheterization is used to diagnose and/or treat various heart conditions. Doctors may recommend this procedure for a number of different reasons. The most common reason is to evaluate chest pain. Chest pain can be a symptom of coronary artery disease (CAD), and cardiac catheterization can show whether plaque is narrowing or blocking your heart's arteries. This procedure is also used to evaluate the valves, as well as measure the blood flow and oxygen levels in different parts of your heart. For further information please visit [email or web address]. Please follow  instruction sheet, as given.   Follow-Up: . You have been referred to Cardiothoracic Surgery. They will call you to schedule an appointment after your procedures have been completed.   Any Other Special Instructions Will Be Listed Below (If Applicable).       [facility] OFFICE [place], [place]  [place] [address] Dept: [phone] Loc: [place]  08/16/2018   You are scheduled for a TEE on 08/24/18 with Dr. Harrington Challenger.  Please arrive at the [facility] (Main Entrance A) at [facility]: [address] [place],  [address] at 8:15 AM. (1 hour prior to procedure)  You are scheduled for a Cardiac Catheterization on Friday, February 7 with Dr. Larae Grooms at 12:00 PM. This will be after your TEE.  Nothing to eat or drink after midnight, except sips of water to take your medicine.  Special note: Every effort is made to have your procedure done on time. Please understand that emergencies sometimes delay scheduled procedures.  On the morning of your procedure, take a baby Aspirin 81 mg and any morning medicines and any of your regular morning medicines. You may use sips of water.  5. Plan for one night stay--bring personal belongings. 6. Bring a current list of your medications and current insurance cards. 7. You MUST have a responsible person to drive you home. 8. Someone MUST be with you the first 24 hours after you arrive home or your discharge will be delayed. 9. Please wear clothes that are easy  to get on and off and wear slip-on shoes.  Thank you for allowing [place] to care for you!   -- [facility]     Coronary Angiogram A coronary angiogram is an X-ray procedure that is used to examine the arteries in the heart. In this procedure, a dye (contrast dye) is injected through a long, thin tube (catheter). The catheter is inserted  through the groin, wrist, or arm. The dye is injected into each artery, then X-rays are taken to show if there is a blockage in the arteries of the heart. This procedure can also show if you have valve disease or a disease of the aorta, and it can be used to check the overall function of your heart muscle. You may have a coronary angiogram if:  You are having chest pain, or other symptoms of angina, and you are at risk for heart disease.  You have an abnormal electrocardiogram (ECG) or stress test.  You have chest pain and heart failure.  You are having irregular heart rhythms.  You and your health care provider determine that the benefits of the test information outweigh the risks of the procedure. Let your health care provider know about:  Any allergies you have, including allergies to contrast dye.  All medicines you are taking, including vitamins, herbs, eye drops, creams, and over-the-counter medicines.  Any problems you or family members have had with anesthetic medicines.  Any blood disorders you have.  Any surgeries you have had.  History of kidney problems or kidney failure.  Any medical conditions you have.  Whether you are pregnant or may be pregnant. What are the risks? Generally, this is a safe procedure. However, problems may occur, including:  Infection.  Allergic reaction to medicines or dyes that are used.  Bleeding from the access site or other locations.  Kidney injury, especially in people with impaired kidney function.  Stroke (rare).  Heart attack (rare).  Damage to other structures or organs. What happens before the procedure? Staying hydrated Follow instructions from your health care provider about hydration, which may include:  Up to 2 hours before the procedure - you may continue to drink clear liquids, such as water, clear fruit juice, black coffee, and plain tea. Eating and drinking restrictions Follow instructions from your health care  provider about eating and drinking, which may include:  8 hours before the procedure - stop eating heavy meals or foods such as meat, fried foods, or fatty foods.  6 hours before the procedure - stop eating light meals or foods, such as toast or cereal.  2 hours before the procedure - stop drinking clear liquids. General instructions  Ask your health care provider about: ? Changing or stopping your regular medicines. This is especially important if you are taking diabetes medicines or blood thinners. ? Taking medicines such as ibuprofen. These medicines can thin your blood. Do not take these medicines before your procedure if your health care provider instructs you not to, though aspirin may be recommended prior to coronary angiograms.  Plan to have someone take you home from the hospital or clinic.  You may need to have blood tests or X-rays done. What happens during the procedure?  An IV tube will be inserted into one of your veins.  You will be given one or more of the following: ? A medicine to help you relax (sedative). ? A medicine to numb the area where the catheter will be inserted into an artery (local  anesthetic).  To reduce your risk of infection: ? Your health care team will wash or sanitize their hands. ? Your skin will be washed with soap. ? Hair may be removed from the area where the catheter will be inserted.  You will be connected to a continuous ECG monitor.  The catheter will be inserted into an artery. The location may be in your groin, in your wrist, or in the fold of your arm (near your elbow).  A type of X-ray (fluoroscopy) will be used to help guide the catheter to the opening of the blood vessel that is being examined.  A dye will be injected into the catheter, and X-rays will be taken. The dye will help to show where any narrowing or blockages are located in the heart arteries.  Tell your health care provider if you have any chest pain or trouble  breathing during the procedure.  If blockages are found, your health care provider may perform another procedure, such as inserting a coronary stent. The procedure may vary among health care providers and hospitals. What happens after the procedure?  After the procedure, you will need to keep the area still for a few hours, or for as long as told by your health care provider. If the procedure is done through the groin, you will be instructed to not bend and not cross your legs.  The insertion site will be checked frequently.  The pulse in your foot or wrist will be checked frequently.  You may have additional blood tests, X-rays, and a test that records the electrical activity of your heart (ECG).  Do not drive for 24 hours if you were given a sedative. Summary  A coronary angiogram is an X-ray procedure that is used to look into the arteries in the heart.  During the procedure, a dye (contrast dye) is injected through a long, thin tube (catheter). The catheter is inserted through the groin, wrist, or arm.  Tell your health care provider about any allergies you have, including allergies to contrast dye.  After the procedure, you will need to keep the area still for a few hours, or for as long as told by your health care provider. This information is not intended to replace advice given to you by your health care provider. Make sure you discuss any questions you have with your health care provider. Document Released: 01/08/2003 Document Revised: 04/15/2016 Document Reviewed: 04/15/2016 Elsevier Interactive Patient Education  2019 [employer].    Transesophageal Echocardiogram Transesophageal echocardiogram (TEE) is a test that uses sound waves to take pictures of your heart. TEE is done by passing a flexible tube down the esophagus. The esophagus is the tube that carries food from the throat to the stomach. The pictures give detailed images of your heart. This can help your doctor see  if there are problems with your heart. What happens before the procedure? Staying hydrated Follow instructions from your doctor about hydration, which may include:  Up to 3 hours before the procedure - you may continue to drink clear liquids, such as: ? Water. ? Clear fruit juice. ? Black coffee. ? Plain tea.  Eating and drinking Follow instructions from your doctor about eating and drinking, which may include:  8 hours before the procedure - stop eating heavy meals or foods such as meat, fried foods, or fatty foods.  6 hours before the procedure - stop eating light meals or foods, such as toast or cereal.  6 hours before the procedure -  stop drinking milk or drinks that contain milk.  3 hours before the procedure - stop drinking clear liquids. General instructions  You will need to take out any dentures or retainers.  Plan to have someone take you home from the hospital or clinic.  If you will be going home right after the procedure, plan to have someone with you for 24 hours.  Ask your doctor about: ? Changing or stopping your normal medicines. This is important if you take diabetes medicines or blood thinners. ? Taking over-the-counter medicines, vitamins, herbs, and supplements. ? Taking medicines such as aspirin and ibuprofen. These medicines can thin your blood. Do not take these medicines unless your doctor tells you to take them. What happens during the procedure?  To lower your risk of infection, your doctors will wash or clean their hands.  An IV will be put into one of your veins.  You will be given a medicine to help you relax (sedative).  A medicine may be sprayed or gargled. This numbs the back of your throat.  Your blood pressure, heart rate, and breathing will be watched.  You may be asked to lay on your left side.  A bite block will be placed in your mouth. This keeps you from biting the tube.  The tip of the TEE probe will be placed into the back of  your mouth.  You will be asked to swallow.  Your doctor will take pictures of your heart.  The probe and bite block will be taken out. The procedure may vary among doctors and hospitals. What happens after the procedure?   Your blood pressure, heart rate, breathing rate, and blood oxygen level will be watched until the medicines you were given have worn off.  When you first wake up, your throat may feel sore and numb. This will get better over time. You will not be allowed to eat or drink until the numbness has gone away.  Do not drive for 24 hours if you were given a medicine to help you relax. Summary  TEE is a test that uses sound waves to take pictures of your heart.  You will be given a medicine to help you relax.  Do not drive for 24 hours if you were given a medicine to help you relax. This information is not intended to replace advice given to you by your health care provider. Make sure you discuss any questions you have with your health care provider. Document Released: 05/01/2009 Document Revised: 03/23/2018 Document Reviewed: 10/05/2016 Elsevier Interactive Patient Education  2019 [employer].

## 2018-08-17 LAB — BASIC METABOLIC PANEL
BUN/Creatinine Ratio: 14 (ref 10–24)
BUN: 20 mg/dL (ref 8–27)
CO2: 21 mmol/L (ref 20–29)
CREATININE: 1.45 mg/dL — AB (ref 0.76–1.27)
Calcium: 9.3 mg/dL (ref 8.6–10.2)
Chloride: 101 mmol/L (ref 96–106)
GFR calc Af Amer: 58 mL/min/{1.73_m2} — ABNORMAL LOW (ref >59)
GFR calc non Af Amer: 51 mL/min/{1.73_m2} — ABNORMAL LOW (ref >59)
Glucose: 95 mg/dL (ref 65–99)
Potassium: 4.3 mmol/L (ref 3.5–5.2)
Sodium: 138 mmol/L (ref 134–144)

## 2018-08-17 LAB — CBC
HEMOGLOBIN: 14.5 g/dL (ref 13.0–17.7)
Hematocrit: 41.8 % (ref 37.5–51.0)
MCH: 33 pg (ref 26.6–33.0)
MCHC: 34.7 g/dL (ref 31.5–35.7)
MCV: 95 fL (ref 79–97)
Platelets: 214 10*3/uL (ref 150–450)
RBC: 4.39 x10E6/uL (ref 4.14–5.80)
RDW: 12.4 % (ref 11.6–15.4)
WBC: 8.1 10*3/uL (ref 3.4–10.8)

## 2018-08-23 ENCOUNTER — Other Ambulatory Visit: Payer: Self-pay | Admitting: Interventional Cardiology

## 2018-08-23 ENCOUNTER — Telehealth: Payer: Self-pay | Admitting: Interventional Cardiology

## 2018-08-23 DIAGNOSIS — I340 Nonrheumatic mitral (valve) insufficiency: Secondary | ICD-10-CM

## 2018-08-23 NOTE — Telephone Encounter (Addendum)
Pt contacted pre-catheterization scheduled at [facility] for: Friday August 24, 2018 12 noon/TEE 9AM Verified arrival time and place: [place] Entrance A at: 8 AM  Nothing to eat or drink after midnight. Contrast allergy: no  AM meds can be  taken pre-cath with sip of water including: ASA 81 mg  Confirmed patient has responsible person to drive home post procedure and observe 24 hours after arriving home: yes  Pt was concerned about shortness of breath.  Pt states no change in shortness of breath since office visit with  Dr Irish Lack 08/16/18. Pt denies any LE edema, weight gain, abdominal bloating, no pattern to shortness of breath. Patient states he can walk in park without symptoms. Pt wonders if shortness of breath may be partly due to anxiety about medical condition and evaluation/testing.  Pt advised Dr Irish Lack will have information from testing tomorrow and can recommend treatment for shortness of breath after testing completed. Pt was comfortable with this plan, thanked me for call.

## 2018-08-23 NOTE — Telephone Encounter (Signed)
New message   Patient has a questions about sob that he is having in relation to having procedure done on 08/24/2018. Patient states that he is having some anxiety about the procedure please call to discuss.

## 2018-08-24 ENCOUNTER — Encounter (HOSPITAL_COMMUNITY): Payer: Self-pay | Admitting: *Deleted

## 2018-08-24 ENCOUNTER — Ambulatory Visit (HOSPITAL_COMMUNITY): Payer: 59 | Admitting: Anesthesiology

## 2018-08-24 ENCOUNTER — Other Ambulatory Visit: Payer: Self-pay

## 2018-08-24 ENCOUNTER — Encounter (HOSPITAL_COMMUNITY)
Admission: RE | Disposition: A | Discharge status: Home / Self Care | Payer: Self-pay | Source: Home / Self Care | Attending: Internal Medicine

## 2018-08-24 ENCOUNTER — Ambulatory Visit (HOSPITAL_BASED_OUTPATIENT_CLINIC_OR_DEPARTMENT_OTHER): Payer: 59

## 2018-08-24 ENCOUNTER — Ambulatory Visit (HOSPITAL_COMMUNITY)
Admission: RE | Admit: 2018-08-24 | Discharge: 2018-08-24 | Disposition: A | Discharge status: Home / Self Care | Payer: 59 | Attending: Internal Medicine | Admitting: Internal Medicine

## 2018-08-24 DIAGNOSIS — E7800 Pure hypercholesterolemia, unspecified: Secondary | ICD-10-CM | POA: Insufficient documentation

## 2018-08-24 DIAGNOSIS — Z79899 Other long term (current) drug therapy: Secondary | ICD-10-CM | POA: Diagnosis not present

## 2018-08-24 DIAGNOSIS — I340 Nonrheumatic mitral (valve) insufficiency: Secondary | ICD-10-CM | POA: Diagnosis not present

## 2018-08-24 DIAGNOSIS — R0602 Shortness of breath: Secondary | ICD-10-CM | POA: Insufficient documentation

## 2018-08-24 DIAGNOSIS — Z87891 Personal history of nicotine dependence: Secondary | ICD-10-CM | POA: Insufficient documentation

## 2018-08-24 DIAGNOSIS — Z8249 Family history of ischemic heart disease and other diseases of the circulatory system: Secondary | ICD-10-CM | POA: Insufficient documentation

## 2018-08-24 DIAGNOSIS — I061 Rheumatic aortic insufficiency: Secondary | ICD-10-CM | POA: Insufficient documentation

## 2018-08-24 HISTORY — PX: ABDOMINAL AORTOGRAM: CATH118222

## 2018-08-24 HISTORY — PX: RIGHT/LEFT HEART CATH AND CORONARY ANGIOGRAPHY: CATH118266

## 2018-08-24 HISTORY — PX: TEE WITHOUT CARDIOVERSION: SHX5443

## 2018-08-24 LAB — POCT I-STAT EG7
Acid-base deficit: 4 mmol/L — ABNORMAL HIGH (ref 0.0–2.0)
Acid-base deficit: 3 mmol/L — ABNORMAL HIGH (ref 0.0–2.0)
BICARBONATE: 23.1 mmol/L (ref 20.0–28.0)
Bicarbonate: 21.9 mmol/L (ref 20.0–28.0)
Calcium, Ion: 1.26 mmol/L (ref 1.15–1.40)
Calcium, Ion: 1.28 mmol/L (ref 1.15–1.40)
HCT: 38 % — ABNORMAL LOW (ref 39.0–52.0)
HCT: 38 % — ABNORMAL LOW (ref 39.0–52.0)
HEMOGLOBIN: 12.9 g/dL — AB (ref 13.0–17.0)
Hemoglobin: 12.9 g/dL — ABNORMAL LOW (ref 13.0–17.0)
O2 Saturation: 71 %
O2 Saturation: 75 %
PH VEN: 7.338 (ref 7.250–7.430)
PO2 VEN: 40 mmHg (ref 32.0–45.0)
Potassium: 4.1 mmol/L (ref 3.5–5.1)
Potassium: 4.2 mmol/L (ref 3.5–5.1)
Sodium: 141 mmol/L (ref 135–145)
Sodium: 141 mmol/L (ref 135–145)
TCO2: 23 mmol/L (ref 22–32)
TCO2: 24 mmol/L (ref 22–32)
pCO2, Ven: 40.4 mmHg — ABNORMAL LOW (ref 44.0–60.0)
pCO2, Ven: 42.9 mmHg — ABNORMAL LOW (ref 44.0–60.0)
pH, Ven: 7.342 (ref 7.250–7.430)
pO2, Ven: 43 mmHg (ref 32.0–45.0)

## 2018-08-24 LAB — POCT I-STAT 7, (LYTES, BLD GAS, ICA,H+H)
Acid-base deficit: 2 mmol/L (ref 0.0–2.0)
Bicarbonate: 22 mmol/L (ref 20.0–28.0)
Calcium, Ion: 1.25 mmol/L (ref 1.15–1.40)
HCT: 38 % — ABNORMAL LOW (ref 39.0–52.0)
Hemoglobin: 12.9 g/dL — ABNORMAL LOW (ref 13.0–17.0)
O2 Saturation: 97 %
Potassium: 4.4 mmol/L (ref 3.5–5.1)
Sodium: 140 mmol/L (ref 135–145)
TCO2: 23 mmol/L (ref 22–32)
pCO2 arterial: 35 mmHg (ref 32.0–48.0)
pH, Arterial: 7.405 (ref 7.350–7.450)
pO2, Arterial: 92 mmHg (ref 83.0–108.0)

## 2018-08-24 SURGERY — RIGHT/LEFT HEART CATH AND CORONARY ANGIOGRAPHY
Anesthesia: LOCAL

## 2018-08-24 SURGERY — ECHOCARDIOGRAM, TRANSESOPHAGEAL
Anesthesia: Monitor Anesthesia Care

## 2018-08-24 MED ORDER — SODIUM CHLORIDE 0.9 % WEIGHT BASED INFUSION
1.0000 mL/kg/h | INTRAVENOUS | Status: DC
  Administered 2018-08-24: 1.004 mL/kg/h via INTRAVENOUS
  Administered 2018-08-24: 1 mL/kg/h via INTRAVENOUS

## 2018-08-24 MED ORDER — SODIUM CHLORIDE 0.9 % IV SOLN: INTRAVENOUS | Status: AC

## 2018-08-24 MED ORDER — IOHEXOL 350 MG/ML SOLN
INTRAVENOUS | Status: DC | PRN
  Administered 2018-08-24: 45 mL via INTRA_ARTERIAL

## 2018-08-24 MED ORDER — BUTAMBEN-TETRACAINE-BENZOCAINE 2-2-14 % EX AERO
INHALATION_SPRAY | CUTANEOUS | Status: DC | PRN
  Administered 2018-08-24: 2 via TOPICAL

## 2018-08-24 MED ORDER — LIDOCAINE HCL (PF) 1 % IJ SOLN
INTRAMUSCULAR | Status: AC
  Filled 2018-08-24: qty 30

## 2018-08-24 MED ORDER — SODIUM CHLORIDE 0.9 % WEIGHT BASED INFUSION
3.0000 mL/kg/h | INTRAVENOUS | Status: AC
  Administered 2018-08-24: 3 mL/kg/h via INTRAVENOUS

## 2018-08-24 MED ORDER — SODIUM CHLORIDE 0.9 % IV SOLN: INTRAVENOUS | Status: DC

## 2018-08-24 MED ORDER — HEPARIN (PORCINE) IN NACL 1000-0.9 UT/500ML-% IV SOLN
INTRAVENOUS | Status: DC | PRN
  Administered 2018-08-24 (×2): 500 mL

## 2018-08-24 MED ORDER — SODIUM CHLORIDE 0.9 % IV SOLN: 250.0000 mL | INTRAVENOUS | Status: DC | PRN

## 2018-08-24 MED ORDER — VERAPAMIL HCL 2.5 MG/ML IV SOLN
INTRAVENOUS | Status: AC
  Filled 2018-08-24: qty 2

## 2018-08-24 MED ORDER — SODIUM CHLORIDE 0.9% FLUSH: 3.0000 mL | INTRAVENOUS | Status: DC | PRN

## 2018-08-24 MED ORDER — FENTANYL CITRATE (PF) 100 MCG/2ML IJ SOLN
INTRAMUSCULAR | Status: DC | PRN
  Administered 2018-08-24: 25 ug via INTRAVENOUS

## 2018-08-24 MED ORDER — HEPARIN SODIUM (PORCINE) 1000 UNIT/ML IJ SOLN
INTRAMUSCULAR | Status: DC | PRN
  Administered 2018-08-24: 4000 [IU] via INTRAVENOUS

## 2018-08-24 MED ORDER — MIDAZOLAM HCL 2 MG/2ML IJ SOLN
INTRAMUSCULAR | Status: AC
  Filled 2018-08-24: qty 2

## 2018-08-24 MED ORDER — ACETAMINOPHEN 325 MG PO TABS: 650.0000 mg | ORAL_TABLET | ORAL | Status: DC | PRN

## 2018-08-24 MED ORDER — ASPIRIN 81 MG PO CHEW: 81.0000 mg | CHEWABLE_TABLET | ORAL | Status: DC

## 2018-08-24 MED ORDER — PROPOFOL 500 MG/50ML IV EMUL
INTRAVENOUS | Status: DC | PRN
  Administered 2018-08-24: 75 ug/kg/min via INTRAVENOUS

## 2018-08-24 MED ORDER — PROPOFOL 10 MG/ML IV BOLUS
INTRAVENOUS | Status: DC | PRN
  Administered 2018-08-24: 20 mg via INTRAVENOUS

## 2018-08-24 MED ORDER — LIDOCAINE HCL (CARDIAC) PF 100 MG/5ML IV SOSY
PREFILLED_SYRINGE | INTRAVENOUS | Status: DC | PRN
  Administered 2018-08-24: 100 mg via INTRAVENOUS

## 2018-08-24 MED ORDER — HEPARIN (PORCINE) IN NACL 1000-0.9 UT/500ML-% IV SOLN
INTRAVENOUS | Status: AC
  Filled 2018-08-24: qty 1000

## 2018-08-24 MED ORDER — ONDANSETRON HCL 4 MG/2ML IJ SOLN: 4.0000 mg | Freq: Four times a day (QID) | INTRAMUSCULAR | Status: DC | PRN

## 2018-08-24 MED ORDER — FENTANYL CITRATE (PF) 100 MCG/2ML IJ SOLN
INTRAMUSCULAR | Status: AC
  Filled 2018-08-24: qty 2

## 2018-08-24 MED ORDER — VERAPAMIL HCL 2.5 MG/ML IV SOLN
INTRAVENOUS | Status: DC | PRN
  Administered 2018-08-24: 10 mL via INTRA_ARTERIAL

## 2018-08-24 MED ORDER — SODIUM CHLORIDE 0.9 % IV SOLN
INTRAVENOUS | Status: DC | PRN
  Administered 2018-08-24: 09:00:00 via INTRAVENOUS

## 2018-08-24 MED ORDER — LIDOCAINE HCL (PF) 1 % IJ SOLN
INTRAMUSCULAR | Status: DC | PRN
  Administered 2018-08-24: 2 mL
  Administered 2018-08-24: 3 mL

## 2018-08-24 MED ORDER — MIDAZOLAM HCL 2 MG/2ML IJ SOLN
INTRAMUSCULAR | Status: DC | PRN
  Administered 2018-08-24: 2 mg via INTRAVENOUS

## 2018-08-24 MED ORDER — SODIUM CHLORIDE 0.9% FLUSH: 3.0000 mL | Freq: Two times a day (BID) | INTRAVENOUS | Status: DC

## 2018-08-24 SURGICAL SUPPLY — 13 items
CATH 5FR JL3.5 JR4 ANG PIG MP (CATHETERS) ×1 IMPLANT
CATH BALLN WEDGE 5F 110CM (CATHETERS) ×1 IMPLANT
DEVICE RAD COMP TR BAND LRG (VASCULAR PRODUCTS) ×1 IMPLANT
GLIDESHEATH SLEND SS 6F .021 (SHEATH) ×1 IMPLANT
GUIDEWIRE .025 260CM (WIRE) ×1 IMPLANT
GUIDEWIRE INQWIRE 1.5J.035X260 (WIRE) IMPLANT
INQWIRE 1.5J .035X260CM (WIRE) ×2
KIT HEART LEFT (KITS) ×2 IMPLANT
PACK CARDIAC CATHETERIZATION (CUSTOM PROCEDURE TRAY) ×2 IMPLANT
SHEATH GLIDE SLENDER 4/5FR (SHEATH) ×1 IMPLANT
SYR MEDRAD MARK 7 150ML (SYRINGE) ×1 IMPLANT
TRANSDUCER W/STOPCOCK (MISCELLANEOUS) ×2 IMPLANT
TUBING CIL FLEX 10 FLL-RA (TUBING) ×2 IMPLANT

## 2018-08-24 NOTE — Progress Notes (Signed)
  Echocardiogram Echocardiogram Transesophageal has been performed.  Bobbye Charleston 08/24/2018, 9:39 AM

## 2018-08-24 NOTE — Discharge Instructions (Signed)
Drink plenty of fluids  °Keep right arm at or above heart level.  °Radial Site Care ° °This sheet gives you information about how to care for yourself after your procedure. Your health care provider may also give you more specific instructions. If you have problems or questions, contact your health care provider. °What can I expect after the procedure? °After the procedure, it is common to have: °· Bruising and tenderness at the catheter insertion area. °Follow these instructions at home: °Medicines °· Take over-the-counter and prescription medicines only as told by your health care provider. °Insertion site care °· Follow instructions from your health care provider about how to take care of your insertion site. Make sure you: °? Wash your hands with soap and water before you change your bandage (dressing). If soap and water are not available, use hand sanitizer. °? Change your dressing as told by your health care provider. °? Leave stitches (sutures), skin glue, or adhesive strips in place. These skin closures may need to stay in place for 2 weeks or longer. If adhesive strip edges start to loosen and curl up, you may trim the loose edges. Do not remove adhesive strips completely unless your health care provider tells you to do that. °· Check your insertion site every day for signs of infection. Check for: °? Redness, swelling, or pain. °? Fluid or blood. °? Pus or a bad smell. °? Warmth. °· Do not take baths, swim, or use a hot tub until your health care provider approves. °· You may shower 24-48 hours after the procedure, or as directed by your health care provider. °? Remove the dressing and gently wash the site with plain soap and water. °? Pat the area dry with a clean towel. °? Do not rub the site. That could cause bleeding. °· Do not apply powder or lotion to the site. °Activity ° °· For 24 hours after the procedure, or as directed by your health care provider: °? Do not flex or bend the affected arm. °? Do  not push or pull heavy objects with the affected arm. °? Do not drive yourself home from the hospital or clinic. You may drive 24 hours after the procedure unless your health care provider tells you not to. °? Do not operate machinery or power tools. °· Do not lift anything that is heavier than 10 lb (4.5 kg), or the limit that you are told, until your health care provider says that it is safe. °· Ask your health care provider when it is okay to: °? Return to work or school. °? Resume usual physical activities or sports. °? Resume sexual activity. °General instructions °· If the catheter site starts to bleed, raise your arm and put firm pressure on the site. If the bleeding does not stop, get help right away. This is a medical emergency. °· If you went home on the same day as your procedure, a responsible adult should be with you for the first 24 hours after you arrive home. °· Keep all follow-up visits as told by your health care provider. This is important. °Contact a health care provider if: °· You have a fever. °· You have redness, swelling, or yellow drainage around your insertion site. °Get help right away if: °· You have unusual pain at the radial site. °· The catheter insertion area swells very fast. °· The insertion area is bleeding, and the bleeding does not stop when you hold steady pressure on the area. °· Your arm or   hand becomes pale, cool, tingly, or numb. °These symptoms may represent a serious problem that is an emergency. Do not wait to see if the symptoms will go away. Get medical help right away. Call your local emergency services (911 in the [place] Do not drive yourself to the hospital. °Summary °· After the procedure, it is common to have bruising and tenderness at the site. °· Follow instructions from your health care provider about how to take care of your radial site wound. Check the wound every day for signs of infection. °· Do not lift anything that is heavier than 10 lb (4.5 kg), or the  limit that you are told, until your health care provider says that it is safe. °This information is not intended to replace advice given to you by your health care provider. Make sure you discuss any questions you have with your health care provider. °Document Released: 08/06/2010 Document Revised: 08/09/2017 Document Reviewed: 08/09/2017 °Elsevier Interactive Patient Education © 2019 [employer] ° °

## 2018-08-24 NOTE — Progress Notes (Signed)
Endoscopy recovery complete following TEE. Patient remain in endo awaiting Cath lab at this time.

## 2018-08-24 NOTE — Anesthesia Postprocedure Evaluation (Signed)
Anesthesia Post Note  Patient: John Page  Procedure(s) Performed: TRANSESOPHAGEAL ECHOCARDIOGRAM (TEE) (N/A )     Patient location during evaluation: PACU Anesthesia Type: MAC Level of consciousness: awake and alert Pain management: pain level controlled Vital Signs Assessment: post-procedure vital signs reviewed and stable Respiratory status: spontaneous breathing, nonlabored ventilation and respiratory function stable Cardiovascular status: blood pressure returned to baseline and stable Postop Assessment: no apparent nausea or vomiting Anesthetic complications: no    Last Vitals:  Vitals:   08/24/18 0940 08/24/18 0955  BP: 137/85 138/79  Pulse: 65 (!) 58  Resp: 17 12  Temp:    SpO2: 97% 99%    Last Pain:  Vitals:   08/24/18 0955  TempSrc:   PainSc: 0-No pain                 Lidia Collum

## 2018-08-24 NOTE — Interval H&P Note (Signed)
History and Physical Interval Note:  08/24/2018 11:28 AM  John Page  has presented today for surgery, with the diagnosis of MR  The various methods of treatment have been discussed with the patient and family. After consideration of risks, benefits and other options for treatment, the patient has consented to  Procedure(s): RIGHT/LEFT HEART CATH AND CORONARY ANGIOGRAPHY (N/A) as a surgical intervention .  The patient's history has been reviewed, patient examined, no change in status, stable for surgery.  I have reviewed the patient's chart and labs.  Questions were answered to the patient's satisfaction.    Planned diagnostic cath only before MV repair.  Larae Grooms

## 2018-08-24 NOTE — Op Note (Signed)
LA, LAA without masses TV is normal    AV is normal  Trivial AI PV is normal  Trace PR MV is mildly thickened with partially flai posterior leaflet (P2) and severe MR directed more anteriorly and around LA LA, RA are normal in size LVEF and RVEF are noraml No PFO by color doppler    Full report to follow

## 2018-08-24 NOTE — Transfer of Care (Signed)
Immediate Anesthesia Transfer of Care Note  Patient: John Page  Procedure(s) Performed: TRANSESOPHAGEAL ECHOCARDIOGRAM (TEE) (N/A )  Patient Location: Endoscopy Unit  Anesthesia Type:MAC  Level of Consciousness: drowsy  Airway & Oxygen Therapy: Patient Spontanous Breathing and Patient connected to nasal cannula oxygen  Post-op Assessment: Report given to RN, Post -op Vital signs reviewed and stable and Patient moving all extremities  Post vital signs: Reviewed and stable  Last Vitals:  Vitals Value Taken Time  BP    Temp    Pulse    Resp    SpO2      Last Pain:  Vitals:   08/24/18 0737  TempSrc: Oral  PainSc: 0-No pain         Complications: No apparent anesthesia complications

## 2018-08-24 NOTE — Interval H&P Note (Signed)
History and Physical Interval Note:  08/24/2018 8:54 AM  John Page  has presented today for surgery, with the diagnosis of MR  The various methods of treatment have been discussed with the patient and family. After consideration of risks, benefits and other options for treatment, the patient has consented to  Procedure(s): TRANSESOPHAGEAL ECHOCARDIOGRAM (TEE) (N/A) as a surgical intervention .  The patient's history has been reviewed, patient examined, no change in status, stable for surgery.  I have reviewed the patient's chart and labs.  Questions were answered to the patient's satisfaction.     Dorris Carnes

## 2018-08-24 NOTE — Anesthesia Preprocedure Evaluation (Addendum)
Anesthesia Evaluation  Patient identified by MRN, date of birth, ID band Patient awake    Reviewed: Allergy & Precautions, NPO status , Patient's Chart, lab work & pertinent test results  History of Anesthesia Complications Negative for: history of anesthetic complications  Airway Mallampati: II  TM Distance: >3 FB Neck ROM: Full    Dental no notable dental hx. (+) Teeth Intact, Dental Advisory Given   Pulmonary neg pulmonary ROS, former smoker,    Pulmonary exam normal breath sounds clear to auscultation       Cardiovascular + Valvular Problems/Murmurs MVP and MR  Rhythm:Regular Rate:Normal + Systolic murmurs    Neuro/Psych negative neurological ROS  negative psych ROS   GI/Hepatic negative GI ROS, Neg liver ROS,   Endo/Other  negative endocrine ROS  Renal/GU negative Renal ROS  negative genitourinary   Musculoskeletal negative musculoskeletal ROS (+)   Abdominal   Peds  Hematology negative hematology ROS (+)   Anesthesia Other Findings 63 yo M for TEE - HLD, severe MVP/MR - TTE 2020: Normal LV systolic function; moderate diastolic dysfunction;mildly dilated ascending aorta; probable flail posterior MVleaflet with severe eccentric MR; mild LAE  Reproductive/Obstetrics                         Anesthesia Physical Anesthesia Plan  ASA: III  Anesthesia Plan: MAC   Post-op Pain Management:    Induction:   PONV Risk Score and Plan: 1 and Propofol infusion and Treatment may vary due to age or medical condition  Airway Management Planned: Nasal Cannula and Simple Face Mask  Additional Equipment: None  Intra-op Plan:   Post-operative Plan:   Informed Consent: I have reviewed the patients History and Physical, chart, labs and discussed the procedure including the risks, benefits and alternatives for the proposed anesthesia with the patient or authorized representative who has indicated  his/her understanding and acceptance.       Plan Discussed with:   Anesthesia Plan Comments:        Anesthesia Quick Evaluation

## 2018-08-24 NOTE — Progress Notes (Signed)
Transferred to cath lab by endo staff.

## 2018-08-27 ENCOUNTER — Encounter (HOSPITAL_COMMUNITY): Payer: Self-pay | Admitting: Interventional Cardiology

## 2018-08-28 ENCOUNTER — Encounter (HOSPITAL_COMMUNITY): Payer: Self-pay | Admitting: Internal Medicine

## 2018-08-30 ENCOUNTER — Other Ambulatory Visit: Payer: Self-pay | Admitting: *Deleted

## 2018-08-30 ENCOUNTER — Encounter: Payer: Self-pay | Admitting: Thoracic Surgery (Cardiothoracic Vascular Surgery)

## 2018-08-30 ENCOUNTER — Other Ambulatory Visit: Payer: Self-pay

## 2018-08-30 ENCOUNTER — Institutional Professional Consult (permissible substitution): Payer: 59 | Admitting: Thoracic Surgery (Cardiothoracic Vascular Surgery)

## 2018-08-30 VITALS — BP 134/79 | HR 68 | Resp 18 | Ht 70.0 in | Wt 157.6 lb

## 2018-08-30 DIAGNOSIS — I340 Nonrheumatic mitral (valve) insufficiency: Secondary | ICD-10-CM

## 2018-08-30 DIAGNOSIS — I059 Rheumatic mitral valve disease, unspecified: Secondary | ICD-10-CM

## 2018-08-30 NOTE — Patient Instructions (Addendum)
   Continue taking all current medications without change through the day before surgery.  Have nothing to eat or drink after midnight the night before surgery.  On the morning of surgery do not take any medications   

## 2018-08-30 NOTE — Progress Notes (Signed)
[address]       [place] [id number]             [place] REPORT  Referring Provider is Jettie Booze, MD PCP is Maury Dus, MD  Chief Complaint  Patient presents with  . Mitral Valve Prolapse    new patient consultation, CATH 08/24/2018, ECHO 08/06/2018  . Mitral Stenosis    HPI:  Patient is a 63 year old male who has been referred for surgical consultation for management of mitral valve prolapse with severe symptomatic primary mitral regurgitation.  Patient has been physically active and healthy all of his adult life.  Approximately 5 years ago he was noted to have a heart murmur on physical exam.  Echocardiogram revealed mitral valve prolapse with moderate mitral regurgitation.  He has been followed regularly ever since by Dr. Irish Lack.  Recent follow-up echocardiogram revealed mitral valve prolapse with severe mitral regurgitation.  Left ventricular function remain normal with ejection fraction estimated 60 to 65%.  The patient subsequently underwent TEE and diagnostic cardiac catheterization on August 24, 2018.  TEE confirmed the presence of mitral valve prolapse with a large flail segment involving a portion of the middle scallop of the posterior leaflet.  There were ruptured primary chordae tendinae and severe mitral regurgitation.  No other significant abnormalities were noted.  Diagnostic cardiac catheterization was notable for normal coronary artery anatomy with no significant coronary artery disease.  Right heart pressures were normal.  Angiography of the abdominal aorta and iliac vessels revealed no significant aortoiliac disease.  Cardiothoracic surgical consultation was requested.  Patient is single and lives alone locally in [place].  He is retired from the [employer].  He currently works part-time at [employer].  He walks on a regular basis although he admits  that for the past year or so he has not been doing any other sort of exercise.  He used to exercise more regularly until he underwent inguinal hernia repair.  He has noticed that over the past year he has developed some symptoms of exertional shortness of breath.  Symptoms occur with moderate level activity.  He also gets short of breath if he lays flat in bed.  He denies any history of chest pain or chest tightness with exertion.  He has not had palpitations, dizzy spells, nor syncope.  He denies any history of PND or lower extremity edema.   Past Medical History:  Diagnosis Date  . Cancer ([place])    melanoma  . Heart murmur   . Mitral valve disorders(424.0)    severe MVP by 07/25/17 echo  . Other and unspecified hyperlipidemia     Past Surgical History:  Procedure Laterality Date  . ABDOMINAL AORTOGRAM N/A 08/24/2018   Procedure: ABDOMINAL AORTOGRAM;  Surgeon: Jettie Booze, MD;  Location: [facility];  Service: Cardiovascular;  Laterality: N/A;  . INGUINAL HERNIA REPAIR Left 10/09/2017   Procedure: LEFT INGUINAL HERNIA REPAIR;  Surgeon: Georganna Skeans, MD;  Location: [facility];  Service: General;  Laterality: Left;  . INSERTION OF MESH Left 10/09/2017   Procedure: INSERTION OF MESH;  Surgeon: Georganna Skeans, MD;  Location: [facility];  Service: General;  Laterality: Left;  Marland Kitchen MELANOMA EXCISION    . RIGHT/LEFT HEART CATH AND CORONARY ANGIOGRAPHY N/A 08/24/2018   Procedure: RIGHT/LEFT HEART CATH AND CORONARY ANGIOGRAPHY;  Surgeon: Jettie Booze, MD;  Location: [facility];  Service: Cardiovascular;  Laterality: N/A;  . TEE WITHOUT CARDIOVERSION N/A 08/24/2018   Procedure: TRANSESOPHAGEAL ECHOCARDIOGRAM (TEE);  Surgeon: Fay Records, MD;  Location: [facility];  Service: Cardiovascular;  Laterality: N/A;  . TONSILLECTOMY      Family History  Problem Relation Age of Onset  . Hypertension Mother   . Heart attack Mother   . Heart disease Mother        CABG  . Lung cancer  Father   . Hypertension Brother     Social History   Socioeconomic History  . Marital status: Single    Spouse name: Not on file  . Number of children: Not on file  . Years of education: Not on file  . Highest education level: Not on file  Occupational History  . Not on file  Social Needs  . Financial resource strain: Not on file  . Food insecurity:    Worry: Not on file    Inability: Not on file  . Transportation needs:    Medical: Not on file    Non-medical: Not on file  Tobacco Use  . Smoking status: Former Smoker    Last attempt to quit: 07/05/1984    Years since quitting: 34.1  . Smokeless tobacco: Never Used  Substance and Sexual Activity  . Alcohol use: Not on file    Comment: occasional  . Drug use: No  . Sexual activity: Not on file  Lifestyle  . Physical activity:    Days per week: Not on file    Minutes per session: Not on file  . Stress: Not on file  Relationships  . Social connections:    Talks on phone: Not on file    Gets together: Not on file    Attends religious service: Not on file    Active member of club or organization: Not on file    Attends meetings of clubs or organizations: Not on file    Relationship status: Not on file  . Intimate partner violence:    Fear of current or ex partner: Not on file    Emotionally abused: Not on file    Physically abused: Not on file    Forced sexual activity: Not on file  Other Topics Concern  . Not on file  Social History Narrative  . Not on file    Current Outpatient Medications  Medication Sig Dispense Refill  . acetaminophen (TYLENOL) 500 MG tablet Take 1,000 mg by mouth daily as needed for moderate pain or headache.    Marland Kitchen amoxicillin (AMOXIL) 500 MG capsule 4 CAPSULES 1 HOUR PRIOR TO DENTAL (Patient taking differently: Take 2,000 mg by mouth See admin instructions. Take 2000 mg 1 hour prior to dental work) 4 capsule 3  . pravastatin (PRAVACHOL) 40 MG tablet Take 40 mg by mouth daily.     No  current facility-administered medications for this visit.     No Known Allergies    Review of Systems:   General:  normal appetite, normal energy, no weight gain, no weight loss, no fever  Cardiac:  no chest pain with exertion, no chest pain at rest, +SOB with exertion, no resting SOB, no PND, + orthopnea, no palpitations, no arrhythmia, no atrial fibrillation, no LE edema, no dizzy spells, no syncope  Respiratory:  + shortness of breath, no home oxygen, no productive cough, no dry cough, no bronchitis, no wheezing, no hemoptysis, no asthma, no pain with inspiration or cough, no sleep apnea, no CPAP at night  GI:   no difficulty swallowing, no reflux, no frequent heartburn, no hiatal hernia, no abdominal pain, no constipation, no diarrhea, no hematochezia, no hematemesis, no melena  GU:   no dysuria,  no frequency, no urinary tract infection, no hematuria, no enlarged prostate, no kidney stones, no kidney disease  Vascular:  no pain suggestive of claudication, no pain in feet, no leg cramps, no varicose veins, no DVT, no non-healing foot ulcer  Neuro:   no stroke, no TIA's, no seizures, no headaches, no temporary blindness one eye,  no slurred speech, no peripheral neuropathy, no chronic pain, no instability of gait, no memory/cognitive dysfunction  Musculoskeletal: no arthritis, no joint swelling, no myalgias, no difficulty walking, normal mobility   Skin:   no rash, no itching, no skin infections, no pressure sores or ulcerations  Psych:   no anxiety, no depression, no nervousness, no unusual recent stress  Eyes:   no blurry vision, no floaters, no recent vision changes, + wears glasses or contacts  ENT:   no hearing loss, no loose or painful teeth, no dentures, last saw dentist December 2019  Hematologic:  no easy bruising, no abnormal bleeding, no clotting disorder, no frequent epistaxis  Endocrine:  no diabetes, does not check CBG's at home     Physical Exam:   BP 134/79 (BP Location:  Left Arm, Patient Position: Sitting, Cuff Size: Normal)   Pulse 68   Resp 18   Ht 5\' 10"  (1.778 m)   Wt 157 lb 9.6 oz (71.5 kg)   SpO2 98% Comment: RA  BMI 22.61 kg/m   General:  Thin,  well-appearing  HEENT:  Unremarkable   Neck:   no JVD, no bruits, no adenopathy   Chest:   clear to auscultation, symmetrical breath sounds, no wheezes, no rhonchi   CV:   RRR, grade IV/VI holosystolic murmur   Abdomen:  soft, non-tender, no masses   Extremities:  warm, well-perfused, pulses palpable, no LE edema  Rectal/GU  Deferred  Neuro:   Grossly non-focal and symmetrical throughout  Skin:   Clean and dry, no rashes, no breakdown   Diagnostic Tests:  Transthoracic Echocardiography  Patient:    Sincere, Liuzzi MR #:       [id number] Study Date: 08/06/2018 Gender:     M Age:        50 Height:     177.8 cm Weight:     73 kg BSA:        1.9 m^2 Pt. Status: Room:   ATTENDING    Kirk Ruths  SONOGRAPHER  Cindy Hazy, RDCS  ORDERING     [place], [place], Outpatient  cc:  ------------------------------------------------------------------- LV EF: 60% -   65%  ------------------------------------------------------------------- Indications:      I34.0 Mitral valve Insufficiency.  ------------------------------------------------------------------- History:   PMH:  Acquired from the patient and from the patient&'s chart.  ------------------------------------------------------------------- Study Conclusions  - Left ventricle: The cavity size was normal. Wall thickness was   normal. Systolic function was normal. The estimated ejection   fraction was in the range of 60% to 65%. Wall motion was normal;   there were no regional wall motion abnormalities. Features are   consistent with a pseudonormal left ventricular filling pattern,   with concomitant abnormal relaxation and increased filling   pressure (grade 2  diastolic dysfunction). Doppler parameters are   consistent with high ventricular filling pressure. - Ascending aorta:  The ascending aorta was mildly dilated. - Mitral valve: Calcified annulus. Mildly thickened leaflets .   Severe prolapse, involving the posterior leaflet. There was   severe regurgitation directed eccentrically and anteriorly. - Left atrium: The atrium was mildly dilated.  Impressions:  - Normal LV systolic function; moderate diastolic dysfunction;   mildly dilated ascending aorta; probable flail posterior MV   leaflet with severe eccentric MR; mild LAE.  ------------------------------------------------------------------- Study data:   Study status:  Routine.  Procedure:  The patient reported no pain pre or post test. Transthoracic echocardiography for left ventricular function evaluation, for right ventricular function evaluation, and for assessment of valvular function. Image quality was adequate.  Study completion:  There were no complications.          Transthoracic echocardiography.  M-mode, complete 2D, spectral Doppler, and color Doppler.  Birthdate: Patient birthdate: 1956/06/26.  Age:  Patient is 63 yr old.  Sex: Gender: male.    BMI: 23.1 kg/m^2.  Blood pressure:     128/82 Patient status:  Outpatient.  Study date:  Study date: 08/06/2018. Study time: 11:36 AM.  Location:  [facility]  -------------------------------------------------------------------  ------------------------------------------------------------------- Left ventricle:  The cavity size was normal. Wall thickness was normal. Systolic function was normal. The estimated ejection fraction was in the range of 60% to 65%. Wall motion was normal; there were no regional wall motion abnormalities. Features are consistent with a pseudonormal left ventricular filling pattern, with concomitant abnormal relaxation and increased filling pressure (grade 2 diastolic dysfunction). Doppler  parameters are consistent with high ventricular filling pressure.  ------------------------------------------------------------------- Aortic valve:   Moderately calcified leaflets. Mobility was not restricted.  Doppler:  Transvalvular velocity was within the normal range. There was no stenosis. There was no regurgitation.  ------------------------------------------------------------------- Aorta:  Aortic root: The aortic root was normal in size. Ascending aorta: The ascending aorta was mildly dilated.  ------------------------------------------------------------------- Mitral valve:   Calcified annulus. Mildly thickened leaflets . Mobility was not restricted.  Severe prolapse, involving the posterior leaflet.  Doppler:  Transvalvular velocity was within the normal range. There was no evidence for stenosis. There was severe regurgitation directed eccentrically and anteriorly.    Peak gradient (D): 9 mm Hg.  ------------------------------------------------------------------- Left atrium:  The atrium was mildly dilated.  ------------------------------------------------------------------- Right ventricle:  The cavity size was normal. Systolic function was normal.  ------------------------------------------------------------------- Pulmonic valve:    Doppler:  Transvalvular velocity was within the normal range. There was no evidence for stenosis. There was trivial regurgitation.  ------------------------------------------------------------------- Tricuspid valve:   Structurally normal valve.    Doppler: Transvalvular velocity was within the normal range. There was trivial regurgitation.  ------------------------------------------------------------------- Pulmonary artery:   Systolic pressure was at the upper limits of normal.  ------------------------------------------------------------------- Right atrium:  The atrium was normal in  size.  ------------------------------------------------------------------- Pericardium:  There was no pericardial effusion.  ------------------------------------------------------------------- Measurements   Left ventricle                          Value        Reference  LV ID, ED, PLAX chordal                 46    mm     43 - 52  LV ID, ES, PLAX chordal                 28    mm     23 - 38  LV  fx shortening, PLAX chordal          39    %      >=29  LV PW thickness, ED                     9     mm     ----------  IVS/LV PW ratio, ED                     1            <=1.3  Stroke volume, 2D                       61    ml     ----------  Stroke volume/bsa, 2D                   32    ml/m^2 ----------  LV e&', lateral                          9.45  cm/s   ----------  LV E/e&', lateral                        15.56        ----------  LV e&', medial                           8.27  cm/s   ----------  LV E/e&', medial                         17.78        ----------  LV e&', average                          8.86  cm/s   ----------  LV E/e&', average                        16.59        ----------    Ventricular septum                      Value        Reference  IVS thickness, ED                       9     mm     ----------    LVOT                                    Value        Reference  LVOT ID, S                              20    mm     ----------  LVOT area                               3.14  cm^2   ----------  LVOT ID  20    mm     ----------  LVOT peak velocity, S                   131   cm/s   ----------  LVOT mean velocity, S                   83.4  cm/s   ----------  LVOT VTI, S                             19.3  cm     ----------  LVOT peak gradient, S                   7     mm Hg  ----------  Stroke volume (SV), LVOT DP             60.6  ml     ----------  Stroke index (SV/bsa), LVOT DP          31.9  ml/m^2 ----------    Aorta                                    Value        Reference  Aortic root ID, ED                      35    mm     ----------  Ascending aorta ID, A-P, S              40    mm     ----------    Left atrium                             Value        Reference  LA ID, A-P, ES                          40    mm     ----------  LA ID/bsa, A-P                          2.1   cm/m^2 <=2.2  LA volume, S                            62.2  ml     ----------  LA volume/bsa, S                        32.7  ml/m^2 ----------  LA volume, ES, 1-p A4C                  41.1  ml     ----------  LA volume/bsa, ES, 1-p A4C              21.6  ml/m^2 ----------  LA volume, ES, 1-p A2C                  80.4  ml     ----------  LA volume/bsa, ES, 1-p A2C              42.3  ml/m^2 ----------    Mitral valve  Value        Reference  Mitral E-wave peak velocity             147   cm/s   ----------  Mitral A-wave peak velocity             120   cm/s   ----------  Mitral deceleration time        (H)     271   ms     150 - 230  Mitral peak gradient, D                 9     mm Hg  ----------  Mitral E/A ratio, peak                  1.2          ----------    Tricuspid valve                         Value        Reference  Tricuspid regurg peak velocity          263   cm/s   ----------  Tricuspid peak RV-RA gradient           28    mm Hg  ----------    Right atrium                            Value        Reference  RA ID, S-I, ES, A4C                     37.6  mm     34 - 49  RA area, ES, A4C                        12.2  cm^2   8.3 - 19.5  RA volume, ES, A/L                      31.9  ml     ----------  RA volume/bsa, ES, A/L                  16.8  ml/m^2 ----------    Right ventricle                         Value        Reference  RV ID, minor axis, ED, A4C base         38    mm     ----------  TAPSE                                   24.3  mm     ----------  RV s&', lateral, S                       18.2  cm/s    ----------  Legend: (L)  and  (H)  mark values outside specified reference range.  ------------------------------------------------------------------- Prepared and Electronically Authenticated by  Kirk Ruths 2020-01-20T15:36:26    TRANSESOPHOGEAL ECHO REPORT       Patient Name:   ALMOND FITZGIBBON Date of Exam: 08/24/2018 Medical Rec #:  [id number]  Height:       70.0 in Accession #:    [id number]     Weight:       158.0 lb Date of Birth:  08/12/1955      BSA:          1.89 m Patient Age:    29 years       BP:           158/99 mmHg Patient Gender: M              HR:           70 bpm. Exam Location:  Inpatient    Procedure: Transesophageal Echo, 3D Echo, Cardiac Doppler and Color Doppler  Indications:     I34.1 Nonrheumatic mitral (valve) prolapse   History:         Patient has prior history of Echocardiogram examinations, most                  recent 08/06/2018. Mitral Valve Disease and Mitral Valve                  Prolapse; Risk Factors: Dyslipidemia.   Sonographer:     Roseanna Rainbow Referring Phys:  [place] Diagnosing Phys: Dorris Carnes MD     PROCEDURE: Patients was monitored while under deep sedation Anesthestetic sedation was provided intravenously by Anesthesiology: 85mg  of Propofol, 100mg  of Lidocaine. Image quality was excellent. The patient developed no complications during the  procedure.  IMPRESSIONS    1. The right ventricle has normal systolic function.  2. The mitral valve is degenerative. Mitral valve regurgitation is severe by color flow Doppler. The MR jet is anteriorly-directed.  3. The tricuspid valve was normal in structure.  4. The aortic valve is normal in structure. Aortic valve regurgitation is trivial by color flow Doppler.  5. The pulmonic valve was normal in structure.  6. Flail P2 segment of mitral valve with anteriorly directed MR.  7. The left ventricle has normal systolic function of 82-95%.  FINDINGS   Left Ventricle: The left ventricle has normal systolic function of 62-13%. Right Ventricle: The right ventricle has normal systolic function. Left Atrium: Left atrial size was normal in size. Right Atrium: Right atrial size was normal in size. Mitral Valve: The mitral valve is degenerative in appearance. Mitral valve regurgitation is severe by color flow Doppler. The MR jet is anteriorly-directed. Tricuspid Valve: The tricuspid valve was normal in structure. Aortic Valve: The aortic valve is normal in structure. Aortic valve regurgitation is trivial by color flow Doppler. Pulmonic Valve: The pulmonic valve was normal in structure. Pulmonic valve regurgitation is trivial by color flow Doppler.   RIGHT ATRIUM RA Pressure: 3 mmHg    Dorris Carnes MD Electronically signed by Dorris Carnes MD Signature Date/Time: 08/24/2018/6:23:04 PM     ABDOMINAL AORTOGRAM  RIGHT/LEFT HEART CATH AND CORONARY ANGIOGRAPHY  Conclusion     LV end diastolic pressure is mildly elevated.  There is no aortic valve stenosis.  No coronary artery disease.  No AAA. No significant aortoiliac artery disease.  Ao sat 93%, PA sat 73%, mean PA 16 mm Hg, mean PCWP 9 mm Hg; CO 5.3 L/min; CI 2.8   Continue with plans for MV repair evaluation.  He will see Dr. Roxy Manns next week.     Surgeon Notes     08/24/2018 9:44 AM Op Note signed by Fay Records, MD  Indications   Nonrheumatic mitral valve regurgitation [I34.0 (ICD-10-CM)]  Procedural Details   Technical Details The risks, benefits, and details of the procedure were explained to the patient. The patient verbalized understanding and wanted to proceed. Informed written consent was obtained.  PROCEDURE TECHNIQUE: After Xylocaine anesthesia a 10F slender sheath was placed in the right radial artery with a single anterior needle wall stick. IV Heparin was given. Left heart cath and Right coronary angiography was done using a Judkins R4 guide catheter. Left coronary  angiography was done using a Judkins L3.5 guide catheter. Abdominal aortography was done using a pigtail catheter. A TR band was used for hemostasis.  Contrast: 45 cc   Estimated blood loss <50 mL.   During this procedure medications were administered to achieve and maintain moderate conscious sedation while the patient's heart rate, blood pressure, and oxygen saturation were continuously monitored and I was present face-to-face 100% of this time.  Medications  (Filter: Administrations occurring from 08/24/18 1132 to 08/24/18 1228)  Medication Rate/Dose/Volume Action  Date Time   midazolam (VERSED) injection (mg) 2 mg Given 08/24/18 1150   Total dose as of 08/30/18 1611        2 mg        fentaNYL (SUBLIMAZE) injection (mcg) 25 mcg Given 08/24/18 1150   Total dose as of 08/30/18 1611        25 mcg        lidocaine (PF) (XYLOCAINE) 1 % injection (mL) 2 mL Given 08/24/18 1153   Total dose as of 08/30/18 1611 3 mL Given 1155   5 mL        Radial Cocktail/Verapamil only (mL) 10 mL Given 08/24/18 1156   Total dose as of 08/30/18 1611        10 mL        heparin injection (Units) 4,000 Units Given 08/24/18 1206   Total dose as of 08/30/18 1611        4,000 Units        iohexol (OMNIPAQUE) 350 MG/ML injection (mL) 45 mL Given 08/24/18 1220   Total dose as of 08/30/18 1611        45 mL        Heparin (Porcine) in NaCl 1000-0.9 UT/500ML-% SOLN (mL) 500 mL Given 08/24/18 1220   Total dose as of 08/30/18 1611 500 mL Given 1220   1,000 mL        Sedation Time   Sedation Time Physician-1: 26 minutes 59 seconds  Complications   Complications documented before study signed (08/24/2018 6:43 PM EST)    No complications were associated with this study.  Documented by Jettie Booze, MD - 08/24/2018 12:28 PM EST    Coronary Findings   Diagnostic  Dominance: Right  Left Main  Vessel was injected. Vessel is normal in caliber. Vessel is angiographically normal.  Left Anterior Descending    Vessel was injected. Vessel is normal in caliber. Vessel is angiographically normal.  Left Circumflex  Vessel was injected. Vessel is normal in caliber. Vessel is angiographically normal.  Right Coronary Artery  Vessel was injected. Vessel is normal in caliber. Vessel is angiographically normal.  Intervention   No interventions have been documented.  Peripheral Vascular Findings   Diagnostic   No diagnostic findings have been documented.  Intervention   No interventions have been documented.  Right Heart   Right Heart Pressures Ao sat 93%, PA sat 73%, mean PA 16 mm Hg, mean PCWP 9 mm Hg; CO 5.3 L/min; CI 2.8  Left Heart  Left Ventricle LV end diastolic pressure is mildly elevated.  Aortic Valve There is no aortic valve stenosis.  Aorta Abdominal Aorta: The abdominal aorta is not aneurysmal.  Coronary Diagrams   Diagnostic  Dominance: Right    Intervention   Peripheral Vascular Diagram   Diagnostic     Intervention   Implants    No implant documentation for this case.  Syngo Images   Show images for CARDIAC CATHETERIZATION  MERGE Images   Show images for CARDIAC CATHETERIZATION   Link to Procedure Log   Procedure Log    Hemo Data    Most Recent Value  Fick Cardiac Output 5.32 L/min  Fick Cardiac Output Index 2.81 (L/min)/BSA  RA A Wave 6 mmHg  RA V Wave 3 mmHg  RA Mean 2 mmHg  RV Systolic Pressure 32 mmHg  RV Diastolic Pressure 0 mmHg  RV EDP 7 mmHg  PA Systolic Pressure 29 mmHg  PA Diastolic Pressure 4 mmHg  PA Mean 16 mmHg  PW A Wave 14 mmHg  PW V Wave 15 mmHg  PW Mean 9 mmHg  AO Systolic Pressure 967 mmHg  AO Diastolic Pressure 63 mmHg  AO Mean 88 mmHg  LV Systolic Pressure 893 mmHg  LV Diastolic Pressure 1 mmHg  LV EDP 17 mmHg  AOp Systolic Pressure 810 mmHg  AOp Diastolic Pressure 72 mmHg  AOp Mean Pressure 94 mmHg  LVp Systolic Pressure 175 mmHg  LVp Diastolic Pressure 0 mmHg  LVp EDP Pressure 15 mmHg  QP/QS 1  TPVR Index 5.69  HRUI  TSVR Index 33.08 HRUI  PVR SVR Ratio 0.08  TPVR/TSVR Ratio 0.17         Impression:  Patient has mitral valve prolapse with stage D severe symptomatic primary mitral regurgitation.  He presents with symptoms of exertional shortness of breath and orthopnea consistent with chronic diastolic congestive heart failure, [place] Heart Association functional class I-II.  I have personally reviewed the patient's recent transthoracic and transesophageal echocardiograms as well as his diagnostic cardiac catheterization with abdominal aortogram.  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  There is severe mitral regurgitation.  Left ventricular size and systolic function appear normal.  There are no other complicating features.  Diagnostic cardiac catheterization reveals normal coronary artery anatomy with no significant coronary artery disease.  Abdominal aortogram reveals no sign of any significant atherosclerotic aortoiliac disease.    Plan:  The patient was counseled at length regarding the indications, risks and potential benefits of mitral valve repair.  The rationale for elective surgery has been explained, including a comparison between surgery and continued medical therapy with close follow-up.  The likelihood of successful and durable valve repair has been discussed with particular reference to the findings of their recent echocardiogram.  Based upon these findings and previous experience, I have quoted them a greater than 95 percent likelihood of successful valve repair.  Alternative surgical approaches have been discussed including a comparison between conventional sternotomy and minimally-invasive techniques. The relative risks and benefits of each have been reviewed as they pertain to the patient's  specific circumstances, and all of their questions have been addressed.  Expectations for his postoperative convalescence have been discussed.  The patient understands and accepts all potential risks of surgery including but not limited to risk of death, stroke or other neurologic complication, myocardial infarction,  congestive heart failure, respiratory failure, renal failure, bleeding requiring transfusion and/or reexploration, arrhythmia, infection or other wound complications, pneumonia, pleural and/or pericardial effusion, pulmonary embolus, aortic dissection or other major vascular complication, or delayed complications related to valve repair or replacement including but not limited to structural valve deterioration and failure, thrombosis, embolization, endocarditis, or paravalvular leak.  Specific risks potentially related to the minimally-invasive approach were discussed at length, including but not limited to risk of conversion to full or partial sternotomy, aortic dissection or other major vascular complication, unilateral acute lung injury or pulmonary edema, phrenic nerve dysfunction or paralysis, rib fracture, chronic pain, lung hernia, or lymphocele.  All of his questions have been answered.  I spent in excess of 90 minutes during the conduct of this office consultation and >50% of this time involved direct face-to-face encounter with the patient for counseling and/or coordination of their care.    Valentina Gu. Roxy Manns, MD 08/30/2018 3:17 PM

## 2018-08-30 NOTE — H&P (View-Only) (Signed)
[address]       [place] [address]             [place] REPORT  Referring Provider is Jettie Booze, MD PCP is Maury Dus, MD  Chief Complaint  Patient presents with  . Mitral Valve Prolapse    new patient consultation, CATH 08/24/2018, ECHO 08/06/2018  . Mitral Stenosis    HPI:  Patient is a 63 year old male who has been referred for surgical consultation for management of mitral valve prolapse with severe symptomatic primary mitral regurgitation.  Patient has been physically active and healthy all of his adult life.  Approximately 5 years ago he was noted to have a heart murmur on physical exam.  Echocardiogram revealed mitral valve prolapse with moderate mitral regurgitation.  He has been followed regularly ever since by Dr. Irish Lack.  Recent follow-up echocardiogram revealed mitral valve prolapse with severe mitral regurgitation.  Left ventricular function remain normal with ejection fraction estimated 60 to 65%.  The patient subsequently underwent TEE and diagnostic cardiac catheterization on August 24, 2018.  TEE confirmed the presence of mitral valve prolapse with a large flail segment involving a portion of the middle scallop of the posterior leaflet.  There were ruptured primary chordae tendinae and severe mitral regurgitation.  No other significant abnormalities were noted.  Diagnostic cardiac catheterization was notable for normal coronary artery anatomy with no significant coronary artery disease.  Right heart pressures were normal.  Angiography of the abdominal aorta and iliac vessels revealed no significant aortoiliac disease.  Cardiothoracic surgical consultation was requested.  Patient is single and lives alone locally in [place].  He is retired from the [employer].  He currently works part-time at [employer].  He walks on a regular basis although he admits  that for the past year or so he has not been doing any other sort of exercise.  He used to exercise more regularly until he underwent inguinal hernia repair.  He has noticed that over the past year he has developed some symptoms of exertional shortness of breath.  Symptoms occur with moderate level activity.  He also gets short of breath if he lays flat in bed.  He denies any history of chest pain or chest tightness with exertion.  He has not had palpitations, dizzy spells, nor syncope.  He denies any history of PND or lower extremity edema.   Past Medical History:  Diagnosis Date  . Cancer ([place])    melanoma  . Heart murmur   . Mitral valve disorders(424.0)    severe MVP by 07/25/17 echo  . Other and unspecified hyperlipidemia     Past Surgical History:  Procedure Laterality Date  . ABDOMINAL AORTOGRAM N/A 08/24/2018   Procedure: ABDOMINAL AORTOGRAM;  Surgeon: Jettie Booze, MD;  Location: [facility];  Service: Cardiovascular;  Laterality: N/A;  . INGUINAL HERNIA REPAIR Left 10/09/2017   Procedure: LEFT INGUINAL HERNIA REPAIR;  Surgeon: Georganna Skeans, MD;  Location: [facility];  Service: General;  Laterality: Left;  . INSERTION OF MESH Left 10/09/2017   Procedure: INSERTION OF MESH;  Surgeon: Georganna Skeans, MD;  Location: [facility];  Service: General;  Laterality: Left;  Marland Kitchen MELANOMA EXCISION    . RIGHT/LEFT HEART CATH AND CORONARY ANGIOGRAPHY N/A 08/24/2018   Procedure: RIGHT/LEFT HEART CATH AND CORONARY ANGIOGRAPHY;  Surgeon: Jettie Booze, MD;  Location: [facility];  Service: Cardiovascular;  Laterality: N/A;  . TEE WITHOUT CARDIOVERSION N/A 08/24/2018   Procedure: TRANSESOPHAGEAL ECHOCARDIOGRAM (TEE);  Surgeon: Fay Records, MD;  Location: [facility];  Service: Cardiovascular;  Laterality: N/A;  . TONSILLECTOMY      Family History  Problem Relation Age of Onset  . Hypertension Mother   . Heart attack Mother   . Heart disease Mother        CABG  . Lung cancer  Father   . Hypertension Brother     Social History   Socioeconomic History  . Marital status: Single    Spouse name: Not on file  . Number of children: Not on file  . Years of education: Not on file  . Highest education level: Not on file  Occupational History  . Not on file  Social Needs  . Financial resource strain: Not on file  . Food insecurity:    Worry: Not on file    Inability: Not on file  . Transportation needs:    Medical: Not on file    Non-medical: Not on file  Tobacco Use  . Smoking status: Former Smoker    Last attempt to quit: 07/05/1984    Years since quitting: 34.1  . Smokeless tobacco: Never Used  Substance and Sexual Activity  . Alcohol use: Not on file    Comment: occasional  . Drug use: No  . Sexual activity: Not on file  Lifestyle  . Physical activity:    Days per week: Not on file    Minutes per session: Not on file  . Stress: Not on file  Relationships  . Social connections:    Talks on phone: Not on file    Gets together: Not on file    Attends religious service: Not on file    Active member of club or organization: Not on file    Attends meetings of clubs or organizations: Not on file    Relationship status: Not on file  . Intimate partner violence:    Fear of current or ex partner: Not on file    Emotionally abused: Not on file    Physically abused: Not on file    Forced sexual activity: Not on file  Other Topics Concern  . Not on file  Social History Narrative  . Not on file    Current Outpatient Medications  Medication Sig Dispense Refill  . acetaminophen (TYLENOL) 500 MG tablet Take 1,000 mg by mouth daily as needed for moderate pain or headache.    Marland Kitchen amoxicillin (AMOXIL) 500 MG capsule 4 CAPSULES 1 HOUR PRIOR TO DENTAL (Patient taking differently: Take 2,000 mg by mouth See admin instructions. Take 2000 mg 1 hour prior to dental work) 4 capsule 3  . pravastatin (PRAVACHOL) 40 MG tablet Take 40 mg by mouth daily.     No  current facility-administered medications for this visit.     No Known Allergies    Review of Systems:   General:  normal appetite, normal energy, no weight gain, no weight loss, no fever  Cardiac:  no chest pain with exertion, no chest pain at rest, +SOB with exertion, no resting SOB, no PND, + orthopnea, no palpitations, no arrhythmia, no atrial fibrillation, no LE edema, no dizzy spells, no syncope  Respiratory:  + shortness of breath, no home oxygen, no productive cough, no dry cough, no bronchitis, no wheezing, no hemoptysis, no asthma, no pain with inspiration or cough, no sleep apnea, no CPAP at night  GI:   no difficulty swallowing, no reflux, no frequent heartburn, no hiatal hernia, no abdominal pain, no constipation, no diarrhea, no hematochezia, no hematemesis, no melena  GU:   no dysuria,  no frequency, no urinary tract infection, no hematuria, no enlarged prostate, no kidney stones, no kidney disease  Vascular:  no pain suggestive of claudication, no pain in feet, no leg cramps, no varicose veins, no DVT, no non-healing foot ulcer  Neuro:   no stroke, no TIA's, no seizures, no headaches, no temporary blindness one eye,  no slurred speech, no peripheral neuropathy, no chronic pain, no instability of gait, no memory/cognitive dysfunction  Musculoskeletal: no arthritis, no joint swelling, no myalgias, no difficulty walking, normal mobility   Skin:   no rash, no itching, no skin infections, no pressure sores or ulcerations  Psych:   no anxiety, no depression, no nervousness, no unusual recent stress  Eyes:   no blurry vision, no floaters, no recent vision changes, + wears glasses or contacts  ENT:   no hearing loss, no loose or painful teeth, no dentures, last saw dentist December 2019  Hematologic:  no easy bruising, no abnormal bleeding, no clotting disorder, no frequent epistaxis  Endocrine:  no diabetes, does not check CBG's at home     Physical Exam:   BP 134/79 (BP Location:  Left Arm, Patient Position: Sitting, Cuff Size: Normal)   Pulse 68   Resp 18   Ht 5\' 10"  (1.778 m)   Wt 157 lb 9.6 oz (71.5 kg)   SpO2 98% Comment: RA  BMI 22.61 kg/m   General:  Thin,  well-appearing  HEENT:  Unremarkable   Neck:   no JVD, no bruits, no adenopathy   Chest:   clear to auscultation, symmetrical breath sounds, no wheezes, no rhonchi   CV:   RRR, grade IV/VI holosystolic murmur   Abdomen:  soft, non-tender, no masses   Extremities:  warm, well-perfused, pulses palpable, no LE edema  Rectal/GU  Deferred  Neuro:   Grossly non-focal and symmetrical throughout  Skin:   Clean and dry, no rashes, no breakdown   Diagnostic Tests:  Transthoracic Echocardiography  Patient:    Eligah, Anello MR #:       [id number] Study Date: 08/06/2018 Gender:     M Age:        36 Height:     177.8 cm Weight:     73 kg BSA:        1.9 m^2 Pt. Status: Room:   ATTENDING    Kirk Ruths  SONOGRAPHER  Cindy Hazy, RDCS  ORDERING     [place], [place], Outpatient  cc:  ------------------------------------------------------------------- LV EF: 60% -   65%  ------------------------------------------------------------------- Indications:      I34.0 Mitral valve Insufficiency.  ------------------------------------------------------------------- History:   PMH:  Acquired from the patient and from the patient&'s chart.  ------------------------------------------------------------------- Study Conclusions  - Left ventricle: The cavity size was normal. Wall thickness was   normal. Systolic function was normal. The estimated ejection   fraction was in the range of 60% to 65%. Wall motion was normal;   there were no regional wall motion abnormalities. Features are   consistent with a pseudonormal left ventricular filling pattern,   with concomitant abnormal relaxation and increased filling   pressure (grade 2  diastolic dysfunction). Doppler parameters are   consistent with high ventricular filling pressure. - Ascending aorta:  The ascending aorta was mildly dilated. - Mitral valve: Calcified annulus. Mildly thickened leaflets .   Severe prolapse, involving the posterior leaflet. There was   severe regurgitation directed eccentrically and anteriorly. - Left atrium: The atrium was mildly dilated.  Impressions:  - Normal LV systolic function; moderate diastolic dysfunction;   mildly dilated ascending aorta; probable flail posterior MV   leaflet with severe eccentric MR; mild LAE.  ------------------------------------------------------------------- Study data:   Study status:  Routine.  Procedure:  The patient reported no pain pre or post test. Transthoracic echocardiography for left ventricular function evaluation, for right ventricular function evaluation, and for assessment of valvular function. Image quality was adequate.  Study completion:  There were no complications.          Transthoracic echocardiography.  M-mode, complete 2D, spectral Doppler, and color Doppler.  Birthdate: Patient birthdate: [birth year].  Age:  Patient is 63 yr old.  Sex: Gender: male.    BMI: 23.1 kg/m^2.  Blood pressure:     128/82 Patient status:  Outpatient.  Study date:  Study date: 08/06/2018. Study time: 11:36 AM.  Location:  [facility]  -------------------------------------------------------------------  ------------------------------------------------------------------- Left ventricle:  The cavity size was normal. Wall thickness was normal. Systolic function was normal. The estimated ejection fraction was in the range of 60% to 65%. Wall motion was normal; there were no regional wall motion abnormalities. Features are consistent with a pseudonormal left ventricular filling pattern, with concomitant abnormal relaxation and increased filling pressure (grade 2 diastolic dysfunction). Doppler  parameters are consistent with high ventricular filling pressure.  ------------------------------------------------------------------- Aortic valve:   Moderately calcified leaflets. Mobility was not restricted.  Doppler:  Transvalvular velocity was within the normal range. There was no stenosis. There was no regurgitation.  ------------------------------------------------------------------- Aorta:  Aortic root: The aortic root was normal in size. Ascending aorta: The ascending aorta was mildly dilated.  ------------------------------------------------------------------- Mitral valve:   Calcified annulus. Mildly thickened leaflets . Mobility was not restricted.  Severe prolapse, involving the posterior leaflet.  Doppler:  Transvalvular velocity was within the normal range. There was no evidence for stenosis. There was severe regurgitation directed eccentrically and anteriorly.    Peak gradient (D): 9 mm Hg.  ------------------------------------------------------------------- Left atrium:  The atrium was mildly dilated.  ------------------------------------------------------------------- Right ventricle:  The cavity size was normal. Systolic function was normal.  ------------------------------------------------------------------- Pulmonic valve:    Doppler:  Transvalvular velocity was within the normal range. There was no evidence for stenosis. There was trivial regurgitation.  ------------------------------------------------------------------- Tricuspid valve:   Structurally normal valve.    Doppler: Transvalvular velocity was within the normal range. There was trivial regurgitation.  ------------------------------------------------------------------- Pulmonary artery:   Systolic pressure was at the upper limits of normal.  ------------------------------------------------------------------- Right atrium:  The atrium was normal in  size.  ------------------------------------------------------------------- Pericardium:  There was no pericardial effusion.  ------------------------------------------------------------------- Measurements   Left ventricle                          Value        Reference  LV ID, ED, PLAX chordal                 46    mm     43 - 52  LV ID, ES, PLAX chordal                 28    mm     23 - 38  LV  fx shortening, PLAX chordal          39    %      >=29  LV PW thickness, ED                     9     mm     ----------  IVS/LV PW ratio, ED                     1            <=1.3  Stroke volume, 2D                       61    ml     ----------  Stroke volume/bsa, 2D                   32    ml/m^2 ----------  LV e&', lateral                          9.45  cm/s   ----------  LV E/e&', lateral                        15.56        ----------  LV e&', medial                           8.27  cm/s   ----------  LV E/e&', medial                         17.78        ----------  LV e&', average                          8.86  cm/s   ----------  LV E/e&', average                        16.59        ----------    Ventricular septum                      Value        Reference  IVS thickness, ED                       9     mm     ----------    LVOT                                    Value        Reference  LVOT ID, S                              20    mm     ----------  LVOT area                               3.14  cm^2   ----------  LVOT ID  20    mm     ----------  LVOT peak velocity, S                   131   cm/s   ----------  LVOT mean velocity, S                   83.4  cm/s   ----------  LVOT VTI, S                             19.3  cm     ----------  LVOT peak gradient, S                   7     mm Hg  ----------  Stroke volume (SV), LVOT DP             60.6  ml     ----------  Stroke index (SV/bsa), LVOT DP          31.9  ml/m^2 ----------    Aorta                                    Value        Reference  Aortic root ID, ED                      35    mm     ----------  Ascending aorta ID, A-P, S              40    mm     ----------    Left atrium                             Value        Reference  LA ID, A-P, ES                          40    mm     ----------  LA ID/bsa, A-P                          2.1   cm/m^2 <=2.2  LA volume, S                            62.2  ml     ----------  LA volume/bsa, S                        32.7  ml/m^2 ----------  LA volume, ES, 1-p A4C                  41.1  ml     ----------  LA volume/bsa, ES, 1-p A4C              21.6  ml/m^2 ----------  LA volume, ES, 1-p A2C                  80.4  ml     ----------  LA volume/bsa, ES, 1-p A2C              42.3  ml/m^2 ----------    Mitral valve  Value        Reference  Mitral E-wave peak velocity             147   cm/s   ----------  Mitral A-wave peak velocity             120   cm/s   ----------  Mitral deceleration time        (H)     271   ms     150 - 230  Mitral peak gradient, D                 9     mm Hg  ----------  Mitral E/A ratio, peak                  1.2          ----------    Tricuspid valve                         Value        Reference  Tricuspid regurg peak velocity          263   cm/s   ----------  Tricuspid peak RV-RA gradient           28    mm Hg  ----------    Right atrium                            Value        Reference  RA ID, S-I, ES, A4C                     37.6  mm     34 - 49  RA area, ES, A4C                        12.2  cm^2   8.3 - 19.5  RA volume, ES, A/L                      31.9  ml     ----------  RA volume/bsa, ES, A/L                  16.8  ml/m^2 ----------    Right ventricle                         Value        Reference  RV ID, minor axis, ED, A4C base         38    mm     ----------  TAPSE                                   24.3  mm     ----------  RV s&', lateral, S                       18.2  cm/s    ----------  Legend: (L)  and  (H)  mark values outside specified reference range.  ------------------------------------------------------------------- Prepared and Electronically Authenticated by  Kirk Ruths 2020-01-20T15:36:26    TRANSESOPHOGEAL ECHO REPORT       Patient Name:   JADIER ROCKERS Date of Exam: 08/24/2018 Medical Rec #:  [id number]  Height:       70.0 in Accession #:    [id number]     Weight:       158.0 lb Date of Birth:  Jun 21, 1956      BSA:          1.89 m Patient Age:    89 years       BP:           158/99 mmHg Patient Gender: M              HR:           70 bpm. Exam Location:  Inpatient    Procedure: Transesophageal Echo, 3D Echo, Cardiac Doppler and Color Doppler  Indications:     I34.1 Nonrheumatic mitral (valve) prolapse   History:         Patient has prior history of Echocardiogram examinations, most                  recent 08/06/2018. Mitral Valve Disease and Mitral Valve                  Prolapse; Risk Factors: Dyslipidemia.   Sonographer:     Roseanna Rainbow Referring Phys:  Hall Diagnosing Phys: Dorris Carnes MD     PROCEDURE: Patients was monitored while under deep sedation Anesthestetic sedation was provided intravenously by Anesthesiology: 85mg  of Propofol, 100mg  of Lidocaine. Image quality was excellent. The patient developed no complications during the  procedure.  IMPRESSIONS    1. The right ventricle has normal systolic function.  2. The mitral valve is degenerative. Mitral valve regurgitation is severe by color flow Doppler. The MR jet is anteriorly-directed.  3. The tricuspid valve was normal in structure.  4. The aortic valve is normal in structure. Aortic valve regurgitation is trivial by color flow Doppler.  5. The pulmonic valve was normal in structure.  6. Flail P2 segment of mitral valve with anteriorly directed MR.  7. The left ventricle has normal systolic function of 28-41%.  FINDINGS   Left Ventricle: The left ventricle has normal systolic function of 32-44%. Right Ventricle: The right ventricle has normal systolic function. Left Atrium: Left atrial size was normal in size. Right Atrium: Right atrial size was normal in size. Mitral Valve: The mitral valve is degenerative in appearance. Mitral valve regurgitation is severe by color flow Doppler. The MR jet is anteriorly-directed. Tricuspid Valve: The tricuspid valve was normal in structure. Aortic Valve: The aortic valve is normal in structure. Aortic valve regurgitation is trivial by color flow Doppler. Pulmonic Valve: The pulmonic valve was normal in structure. Pulmonic valve regurgitation is trivial by color flow Doppler.   RIGHT ATRIUM RA Pressure: 3 mmHg    Dorris Carnes MD Electronically signed by Dorris Carnes MD Signature Date/Time: 08/24/2018/6:23:04 PM     ABDOMINAL AORTOGRAM  RIGHT/LEFT HEART CATH AND CORONARY ANGIOGRAPHY  Conclusion     LV end diastolic pressure is mildly elevated.  There is no aortic valve stenosis.  No coronary artery disease.  No AAA. No significant aortoiliac artery disease.  Ao sat 93%, PA sat 73%, mean PA 16 mm Hg, mean PCWP 9 mm Hg; CO 5.3 L/min; CI 2.8   Continue with plans for MV repair evaluation.  He will see Dr. Roxy Manns next week.     Surgeon Notes     08/24/2018 9:44 AM Op Note signed by Fay Records, MD  Indications   Nonrheumatic mitral valve regurgitation [I34.0 (ICD-10-CM)]  Procedural Details   Technical Details The risks, benefits, and details of the procedure were explained to the patient. The patient verbalized understanding and wanted to proceed. Informed written consent was obtained.  PROCEDURE TECHNIQUE: After Xylocaine anesthesia a 39F slender sheath was placed in the right radial artery with a single anterior needle wall stick. IV Heparin was given. Left heart cath and Right coronary angiography was done using a Judkins R4 guide catheter. Left coronary  angiography was done using a Judkins L3.5 guide catheter. Abdominal aortography was done using a pigtail catheter. A TR band was used for hemostasis.  Contrast: 45 cc   Estimated blood loss <50 mL.   During this procedure medications were administered to achieve and maintain moderate conscious sedation while the patient's heart rate, blood pressure, and oxygen saturation were continuously monitored and I was present face-to-face 100% of this time.  Medications  (Filter: Administrations occurring from 08/24/18 1132 to 08/24/18 1228)  Medication Rate/Dose/Volume Action  Date Time   midazolam (VERSED) injection (mg) 2 mg Given 08/24/18 1150   Total dose as of 08/30/18 1611        2 mg        fentaNYL (SUBLIMAZE) injection (mcg) 25 mcg Given 08/24/18 1150   Total dose as of 08/30/18 1611        25 mcg        lidocaine (PF) (XYLOCAINE) 1 % injection (mL) 2 mL Given 08/24/18 1153   Total dose as of 08/30/18 1611 3 mL Given 1155   5 mL        Radial Cocktail/Verapamil only (mL) 10 mL Given 08/24/18 1156   Total dose as of 08/30/18 1611        10 mL        heparin injection (Units) 4,000 Units Given 08/24/18 1206   Total dose as of 08/30/18 1611        4,000 Units        iohexol (OMNIPAQUE) 350 MG/ML injection (mL) 45 mL Given 08/24/18 1220   Total dose as of 08/30/18 1611        45 mL        Heparin (Porcine) in NaCl 1000-0.9 UT/500ML-% SOLN (mL) 500 mL Given 08/24/18 1220   Total dose as of 08/30/18 1611 500 mL Given 1220   1,000 mL        Sedation Time   Sedation Time Physician-1: 26 minutes 59 seconds  Complications   Complications documented before study signed (08/24/2018 6:43 PM EST)    No complications were associated with this study.  Documented by Jettie Booze, MD - 08/24/2018 12:28 PM EST    Coronary Findings   Diagnostic  Dominance: Right  Left Main  Vessel was injected. Vessel is normal in caliber. Vessel is angiographically normal.  Left Anterior Descending    Vessel was injected. Vessel is normal in caliber. Vessel is angiographically normal.  Left Circumflex  Vessel was injected. Vessel is normal in caliber. Vessel is angiographically normal.  Right Coronary Artery  Vessel was injected. Vessel is normal in caliber. Vessel is angiographically normal.  Intervention   No interventions have been documented.  Peripheral Vascular Findings   Diagnostic   No diagnostic findings have been documented.  Intervention   No interventions have been documented.  Right Heart   Right Heart Pressures Ao sat 93%, PA sat 73%, mean PA 16 mm Hg, mean PCWP 9 mm Hg; CO 5.3 L/min; CI 2.8  Left Heart  Left Ventricle LV end diastolic pressure is mildly elevated.  Aortic Valve There is no aortic valve stenosis.  Aorta Abdominal Aorta: The abdominal aorta is not aneurysmal.  Coronary Diagrams   Diagnostic  Dominance: Right    Intervention   Peripheral Vascular Diagram   Diagnostic     Intervention   Implants    No implant documentation for this case.  Syngo Images   Show images for CARDIAC CATHETERIZATION  MERGE Images   Show images for CARDIAC CATHETERIZATION   Link to Procedure Log   Procedure Log    Hemo Data    Most Recent Value  Fick Cardiac Output 5.32 L/min  Fick Cardiac Output Index 2.81 (L/min)/BSA  RA A Wave 6 mmHg  RA V Wave 3 mmHg  RA Mean 2 mmHg  RV Systolic Pressure 32 mmHg  RV Diastolic Pressure 0 mmHg  RV EDP 7 mmHg  PA Systolic Pressure 29 mmHg  PA Diastolic Pressure 4 mmHg  PA Mean 16 mmHg  PW A Wave 14 mmHg  PW V Wave 15 mmHg  PW Mean 9 mmHg  AO Systolic Pressure 017 mmHg  AO Diastolic Pressure 63 mmHg  AO Mean 88 mmHg  LV Systolic Pressure 793 mmHg  LV Diastolic Pressure 1 mmHg  LV EDP 17 mmHg  AOp Systolic Pressure 903 mmHg  AOp Diastolic Pressure 72 mmHg  AOp Mean Pressure 94 mmHg  LVp Systolic Pressure 009 mmHg  LVp Diastolic Pressure 0 mmHg  LVp EDP Pressure 15 mmHg  QP/QS 1  TPVR Index 5.69  HRUI  TSVR Index 33.08 HRUI  PVR SVR Ratio 0.08  TPVR/TSVR Ratio 0.17         Impression:  Patient has mitral valve prolapse with stage D severe symptomatic primary mitral regurgitation.  He presents with symptoms of exertional shortness of breath and orthopnea consistent with chronic diastolic congestive heart failure, New York Heart Association functional class I-II.  I have personally reviewed the patient's recent transthoracic and transesophageal echocardiograms as well as his diagnostic cardiac catheterization with abdominal aortogram.  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  There is severe mitral regurgitation.  Left ventricular size and systolic function appear normal.  There are no other complicating features.  Diagnostic cardiac catheterization reveals normal coronary artery anatomy with no significant coronary artery disease.  Abdominal aortogram reveals no sign of any significant atherosclerotic aortoiliac disease.    Plan:  The patient was counseled at length regarding the indications, risks and potential benefits of mitral valve repair.  The rationale for elective surgery has been explained, including a comparison between surgery and continued medical therapy with close follow-up.  The likelihood of successful and durable valve repair has been discussed with particular reference to the findings of their recent echocardiogram.  Based upon these findings and previous experience, I have quoted them a greater than 95 percent likelihood of successful valve repair.  Alternative surgical approaches have been discussed including a comparison between conventional sternotomy and minimally-invasive techniques. The relative risks and benefits of each have been reviewed as they pertain to the patient's  specific circumstances, and all of their questions have been addressed.  Expectations for his postoperative convalescence have been discussed.  The patient understands and accepts all potential risks of surgery including but not limited to risk of death, stroke or other neurologic complication, myocardial infarction,  congestive heart failure, respiratory failure, renal failure, bleeding requiring transfusion and/or reexploration, arrhythmia, infection or other wound complications, pneumonia, pleural and/or pericardial effusion, pulmonary embolus, aortic dissection or other major vascular complication, or delayed complications related to valve repair or replacement including but not limited to structural valve deterioration and failure, thrombosis, embolization, endocarditis, or paravalvular leak.  Specific risks potentially related to the minimally-invasive approach were discussed at length, including but not limited to risk of conversion to full or partial sternotomy, aortic dissection or other major vascular complication, unilateral acute lung injury or pulmonary edema, phrenic nerve dysfunction or paralysis, rib fracture, chronic pain, lung hernia, or lymphocele.  All of his questions have been answered.  I spent in excess of 90 minutes during the conduct of this office consultation and >50% of this time involved direct face-to-face encounter with the patient for counseling and/or coordination of their care.    Valentina Gu. Roxy Manns, MD 08/30/2018 3:17 PM

## 2018-09-03 NOTE — Pre-Procedure Instructions (Signed)
ADI DORO  09/03/2018      [id number] - [place], [place] [address] [place] [address] Phone: [phone] Fax: [phone]    Your procedure is scheduled on Wed., Feb. 19, 2020 from 8:30AM-2:54PM  Report to [facility] Entrance "A" at 6:30AM  Call this number if you have problems the morning of surgery:  [phone]   Remember:  Do not eat or drink after midnight on Feb. 18th    Take these medicines the morning of surgery with A SIP OF WATER: Pravastatin (PRAVACHOL)  If needed: Acetaminophen (TYLENOL)  As of today, stop taking all Aspirin Products, Vitamins, Fish oils, and Herbal medications. Also stop all NSAIDS i.e. Advil, Ibuprofen, Motrin, Aleve, Anaprox, Naproxen, BC, Goody Powders, and all Supplements.    Do not wear jewelry.  Do not wear lotions, powders, colognes, or deodorant.  Do not shave 48 hours prior to surgery.  Men may shave face.  Do not bring valuables to the hospital.  [facility] is not responsible for any belongings or valuables.  Contacts, dentures or bridgework may not be worn into surgery.  Leave your suitcase in the car.  After surgery it may be brought to your room.  For patients admitted to the hospital, discharge time will be determined by your treatment team.  Patients discharged the day of surgery will not be allowed to drive home.   Special instructions:   [place]- Preparing For Surgery  Before surgery, you can play an important role. Because skin is not sterile, your skin needs to be as free of germs as possible. You can reduce the number of germs on your skin by washing with CHG (chlorahexidine gluconate) Soap before surgery.  CHG is an antiseptic cleaner which kills germs and bonds with the skin to continue killing germs even after washing.    Oral Hygiene is also important to reduce your risk of infection.  Remember - BRUSH YOUR TEETH  THE MORNING OF SURGERY WITH YOUR REGULAR TOOTHPASTE  Please do not use if you have an allergy to CHG or antibacterial soaps. If your skin becomes reddened/irritated stop using the CHG.  Do not shave (including legs and underarms) for at least 48 hours prior to first CHG shower. It is OK to shave your face.  Please follow these instructions carefully.   1. Shower the NIGHT BEFORE SURGERY and the MORNING OF SURGERY with CHG.   2. If you chose to wash your hair, wash your hair first as usual with your normal shampoo.  3. After you shampoo, rinse your hair and body thoroughly to remove the shampoo.  4. Use CHG as you would any other liquid soap. You can apply CHG directly to the skin and wash gently with a scrungie or a clean washcloth.   5. Apply the CHG Soap to your body ONLY FROM THE NECK DOWN.  Do not use on open wounds or open sores. Avoid contact with your eyes, ears, mouth and genitals (private parts). Wash Face and genitals (private parts)  with your normal soap.  6. Wash thoroughly, paying special attention to the area where your surgery will be performed.  7. Thoroughly rinse your body with warm water from the neck down.  8. DO NOT shower/wash with your normal soap after using and rinsing off the CHG Soap.  9. Pat yourself dry with a CLEAN TOWEL.  10. Wear CLEAN PAJAMAS to bed  the night before surgery, wear comfortable clothes the morning of surgery  11. Place CLEAN SHEETS on your bed the night of your first shower and DO NOT SLEEP WITH PETS.  Day of Surgery:  Do not apply any deodorants/lotions.  Please wear clean clothes to the hospital/surgery center.   Remember to brush your teeth WITH YOUR REGULAR TOOTHPASTE.  Please read over the following fact sheets that you were given. Pain Booklet, Coughing and Deep Breathing, Blood Transfusion Information, MRSA Information and Surgical Site Infection Prevention

## 2018-09-04 ENCOUNTER — Ambulatory Visit (HOSPITAL_BASED_OUTPATIENT_CLINIC_OR_DEPARTMENT_OTHER)
Admission: RE | Admit: 2018-09-04 | Discharge: 2018-09-04 | Disposition: A | Discharge status: Home / Self Care | Payer: 59 | Source: Ambulatory Visit | Attending: Thoracic Surgery (Cardiothoracic Vascular Surgery) | Admitting: Thoracic Surgery (Cardiothoracic Vascular Surgery)

## 2018-09-04 ENCOUNTER — Encounter (HOSPITAL_COMMUNITY): Payer: Self-pay

## 2018-09-04 ENCOUNTER — Ambulatory Visit (HOSPITAL_COMMUNITY)
Admission: RE | Admit: 2018-09-04 | Discharge: 2018-09-04 | Disposition: A | Discharge status: Home / Self Care | Payer: 59 | Source: Ambulatory Visit | Attending: Thoracic Surgery (Cardiothoracic Vascular Surgery) | Admitting: Thoracic Surgery (Cardiothoracic Vascular Surgery)

## 2018-09-04 ENCOUNTER — Encounter (HOSPITAL_COMMUNITY)
Admission: RE | Admit: 2018-09-04 | Discharge: 2018-09-04 | Disposition: A | Discharge status: Home / Self Care | Payer: 59 | Source: Ambulatory Visit | Attending: Thoracic Surgery (Cardiothoracic Vascular Surgery) | Admitting: Thoracic Surgery (Cardiothoracic Vascular Surgery)

## 2018-09-04 DIAGNOSIS — I340 Nonrheumatic mitral (valve) insufficiency: Secondary | ICD-10-CM

## 2018-09-04 LAB — URINALYSIS, ROUTINE W REFLEX MICROSCOPIC
BILIRUBIN URINE: NEGATIVE
Bacteria, UA: NONE SEEN
Glucose, UA: NEGATIVE mg/dL
Ketones, ur: NEGATIVE mg/dL
LEUKOCYTE UA: NEGATIVE
Nitrite: NEGATIVE
Protein, ur: NEGATIVE mg/dL
Specific Gravity, Urine: 1.009 (ref 1.005–1.030)
pH: 6 (ref 5.0–8.0)

## 2018-09-04 LAB — PULMONARY FUNCTION TEST
DL/VA % pred: 91 %
DL/VA: 3.8 ml/min/mmHg/L
DLCO cor % pred: 85 %
DLCO cor: 23.02 ml/min/mmHg
DLCO unc % pred: 81 %
DLCO unc: 21.83 ml/min/mmHg
FEF 25-75 PRE: 3.03 L/s
FEF 25-75 Post: 4 L/sec
FEF2575-%Change-Post: 31 %
FEF2575-%Pred-Post: 144 %
FEF2575-%Pred-Pre: 109 %
FEV1-%Change-Post: 5 %
FEV1-%Pred-Post: 100 %
FEV1-%Pred-Pre: 95 %
FEV1-Post: 3.5 L
FEV1-Pre: 3.33 L
FEV1FVC-%Change-Post: 6 %
FEV1FVC-%Pred-Pre: 105 %
FEV6-%CHANGE-POST: 0 %
FEV6-%Pred-Post: 94 %
FEV6-%Pred-Pre: 94 %
FEV6-Post: 4.15 L
FEV6-Pre: 4.18 L
FEV6FVC-%Change-Post: 0 %
FEV6FVC-%Pred-Post: 105 %
FEV6FVC-%Pred-Pre: 104 %
FVC-%Change-Post: -1 %
FVC-%Pred-Post: 89 %
FVC-%Pred-Pre: 90 %
FVC-Post: 4.15 L
FVC-Pre: 4.2 L
Post FEV1/FVC ratio: 84 %
Post FEV6/FVC ratio: 100 %
Pre FEV1/FVC ratio: 79 %
Pre FEV6/FVC Ratio: 100 %
RV % pred: 91 %
RV: 2.12 L
TLC % PRED: 89 %
TLC: 6.28 L

## 2018-09-04 LAB — COMPREHENSIVE METABOLIC PANEL
ALT: 33 U/L (ref 0–44)
AST: 25 U/L (ref 15–41)
Albumin: 4.1 g/dL (ref 3.5–5.0)
Alkaline Phosphatase: 57 U/L (ref 38–126)
Anion gap: 10 (ref 5–15)
BUN: 17 mg/dL (ref 8–23)
CO2: 18 mmol/L — ABNORMAL LOW (ref 22–32)
CREATININE: 1.32 mg/dL — AB (ref 0.61–1.24)
Calcium: 9.7 mg/dL (ref 8.9–10.3)
Chloride: 109 mmol/L (ref 98–111)
GFR calc Af Amer: >60 mL/min (ref >60)
GFR calc non Af Amer: 57 mL/min — ABNORMAL LOW (ref >60)
Glucose, Bld: 100 mg/dL — ABNORMAL HIGH (ref 70–99)
Potassium: 4.2 mmol/L (ref 3.5–5.1)
Sodium: 137 mmol/L (ref 135–145)
Total Bilirubin: 0.7 mg/dL (ref 0.3–1.2)
Total Protein: 7 g/dL (ref 6.5–8.1)

## 2018-09-04 LAB — BLOOD GAS, ARTERIAL
Acid-base deficit: 2.8 mmol/L — ABNORMAL HIGH (ref 0.0–2.0)
Bicarbonate: 21.2 mmol/L (ref 20.0–28.0)
Drawn by: 421801
FIO2: 21
O2 Saturation: 98.4 %
Patient temperature: 98.6
pCO2 arterial: 35 mmHg (ref 32.0–48.0)
pH, Arterial: 7.399 (ref 7.350–7.450)
pO2, Arterial: 116 mmHg — ABNORMAL HIGH (ref 83.0–108.0)

## 2018-09-04 LAB — APTT: aPTT: 29 seconds (ref 24–36)

## 2018-09-04 LAB — SURGICAL PCR SCREEN
MRSA, PCR: NEGATIVE
Staphylococcus aureus: NEGATIVE

## 2018-09-04 LAB — PROTIME-INR
INR: 0.95
Prothrombin Time: 12.6 seconds (ref 11.4–15.2)

## 2018-09-04 LAB — CBC
HCT: 44.4 % (ref 39.0–52.0)
Hemoglobin: 14.3 g/dL (ref 13.0–17.0)
MCH: 31.5 pg (ref 26.0–34.0)
MCHC: 32.2 g/dL (ref 30.0–36.0)
MCV: 97.8 fL (ref 80.0–100.0)
Platelets: 201 10*3/uL (ref 150–400)
RBC: 4.54 MIL/uL (ref 4.22–5.81)
RDW: 12.3 % (ref 11.5–15.5)
WBC: 7.3 10*3/uL (ref 4.0–10.5)
nRBC: 0 % (ref 0.0–0.2)

## 2018-09-04 LAB — TYPE AND SCREEN
ABO/RH(D): A POS
Antibody Screen: NEGATIVE

## 2018-09-04 LAB — ABO/RH: ABO/RH(D): A POS

## 2018-09-04 MED ORDER — ALBUTEROL SULFATE (2.5 MG/3ML) 0.083% IN NEBU
2.5000 mg | INHALATION_SOLUTION | Freq: Once | RESPIRATORY_TRACT | Status: AC
  Administered 2018-09-04: 2.5 mg via RESPIRATORY_TRACT

## 2018-09-04 MED ORDER — TRANEXAMIC ACID 1000 MG/10ML IV SOLN
1.5000 mg/kg/h | INTRAVENOUS | Status: DC
  Filled 2018-09-04: qty 25

## 2018-09-04 MED ORDER — KENNESTONE BLOOD CARDIOPLEGIA VIAL
13.0000 mL | Status: DC
  Filled 2018-09-04: qty 13

## 2018-09-04 MED ORDER — MAGNESIUM SULFATE 50 % IJ SOLN
40.0000 meq | INTRAMUSCULAR | Status: DC
  Filled 2018-09-04: qty 9.85

## 2018-09-04 MED ORDER — MILRINONE LACTATE IN DEXTROSE 20-5 MG/100ML-% IV SOLN
0.3000 ug/kg/min | INTRAVENOUS | Status: DC
  Filled 2018-09-04: qty 100

## 2018-09-04 MED ORDER — TRANEXAMIC ACID (OHS) BOLUS VIA INFUSION
15.0000 mg/kg | INTRAVENOUS | Status: AC
  Administered 2018-09-05: 1072.5 mg via INTRAVENOUS
  Filled 2018-09-04: qty 1073

## 2018-09-04 MED ORDER — KENNESTONE BLOOD CARDIOPLEGIA (KBC) MANNITOL SYRINGE (20%, 32ML)
32.0000 mL | INTRAVENOUS | Status: DC
  Filled 2018-09-04: qty 32

## 2018-09-04 MED ORDER — TRANEXAMIC ACID (OHS) PUMP PRIME SOLUTION
2.0000 mg/kg | INTRAVENOUS | Status: DC
  Filled 2018-09-04: qty 1.43

## 2018-09-04 MED ORDER — INSULIN REGULAR(HUMAN) IN NACL 100-0.9 UT/100ML-% IV SOLN
INTRAVENOUS | Status: AC
  Administered 2018-09-05: .8 [IU]/h via INTRAVENOUS
  Filled 2018-09-04: qty 100

## 2018-09-04 MED ORDER — NITROGLYCERIN IN D5W 200-5 MCG/ML-% IV SOLN
2.0000 ug/min | INTRAVENOUS | Status: DC
  Filled 2018-09-04: qty 250

## 2018-09-04 MED ORDER — SODIUM CHLORIDE 0.9 % IV SOLN
INTRAVENOUS | Status: DC
  Filled 2018-09-04: qty 30

## 2018-09-04 MED ORDER — VANCOMYCIN HCL 1000 MG IV SOLR
INTRAVENOUS | Status: AC
  Administered 2018-09-05: 1000 mL
  Filled 2018-09-04: qty 1000

## 2018-09-04 MED ORDER — VANCOMYCIN HCL 10 G IV SOLR
1250.0000 mg | INTRAVENOUS | Status: AC
  Administered 2018-09-05: 1250 mg via INTRAVENOUS
  Filled 2018-09-04: qty 1250

## 2018-09-04 MED ORDER — EPINEPHRINE PF 1 MG/ML IJ SOLN
0.0000 ug/min | INTRAVENOUS | Status: DC
  Filled 2018-09-04: qty 4

## 2018-09-04 MED ORDER — DOPAMINE-DEXTROSE 3.2-5 MG/ML-% IV SOLN
0.0000 ug/kg/min | INTRAVENOUS | Status: DC
  Filled 2018-09-04: qty 250

## 2018-09-04 MED ORDER — NOREPINEPHRINE 4 MG/250ML-% IV SOLN
0.0000 ug/min | INTRAVENOUS | Status: DC
  Filled 2018-09-04: qty 250

## 2018-09-04 MED ORDER — PLASMA-LYTE 148 IV SOLN
INTRAVENOUS | Status: AC
  Administered 2018-09-05: 500 mL
  Filled 2018-09-04: qty 2.5

## 2018-09-04 MED ORDER — POTASSIUM CHLORIDE 2 MEQ/ML IV SOLN
80.0000 meq | INTRAVENOUS | Status: DC
  Filled 2018-09-04: qty 40

## 2018-09-04 MED ORDER — SODIUM CHLORIDE 0.9 % IV SOLN
750.0000 mg | INTRAVENOUS | Status: DC
  Filled 2018-09-04: qty 750

## 2018-09-04 MED ORDER — SODIUM CHLORIDE 0.9 % IV SOLN
1.5000 g | INTRAVENOUS | Status: AC
  Administered 2018-09-05: .75 g via INTRAVENOUS
  Administered 2018-09-05: 1.5 g via INTRAVENOUS
  Filled 2018-09-04: qty 1.5

## 2018-09-04 MED ORDER — DEXMEDETOMIDINE HCL IN NACL 400 MCG/100ML IV SOLN
0.1000 ug/kg/h | INTRAVENOUS | Status: AC
  Administered 2018-09-05: .2 ug/kg/h via INTRAVENOUS
  Filled 2018-09-04: qty 100

## 2018-09-04 MED ORDER — PHENYLEPHRINE HCL-NACL 20-0.9 MG/250ML-% IV SOLN
30.0000 ug/min | INTRAVENOUS | Status: AC
  Administered 2018-09-05: 40 ug/min via INTRAVENOUS
  Filled 2018-09-04: qty 250

## 2018-09-04 NOTE — Final Progress Note (Signed)
PCP -  Cardiologist - dr Irish Lack  Chest x-ray - 09/04/18 EKG - 09/04/18 Stress Test -  ECHO - 08/24/18 Cardiac Cath - 08/24/18  Sleep Study - neg CPAP -   Fasting Blood Sugar na-  Checks Blood Sugar _____ times a day  Blood Thinner Instructions:na Aspirin Instructions:  Anesthesia review:   Patient denies shortness of breath, fever, cough and chest pain at PAT appointment   Patient verbalized understanding of instructions that were given to them at the PAT appointment. Patient was also instructed that they will need to review over the PAT instructions again at home before surgery.

## 2018-09-04 NOTE — Progress Notes (Signed)
Pre operative MVR Dopplers completed - Preliminary results in Chart review CV [place], [place]  09/04/2018 8:55 AM

## 2018-09-04 NOTE — Anesthesia Preprocedure Evaluation (Addendum)
Anesthesia Evaluation  Patient identified by MRN, date of birth, ID band Patient awake    Reviewed: Allergy & Precautions, H&P , NPO status , Patient's Chart, lab work & pertinent test results  Airway Mallampati: II  TM Distance: >3 FB Neck ROM: Full    Dental no notable dental hx. (+) Teeth Intact, Dental Advisory Given   Pulmonary neg pulmonary ROS, former smoker,    Pulmonary exam normal breath sounds clear to auscultation       Cardiovascular Exercise Tolerance: Good + Valvular Problems/Murmurs MR  Rhythm:Regular Rate:Normal + Systolic murmurs    Neuro/Psych negative neurological ROS  negative psych ROS   GI/Hepatic negative GI ROS, Neg liver ROS,   Endo/Other  negative endocrine ROS  Renal/GU negative Renal ROS  negative genitourinary   Musculoskeletal   Abdominal   Peds  Hematology negative hematology ROS (+)   Anesthesia Other Findings   Reproductive/Obstetrics negative OB ROS                            Anesthesia Physical Anesthesia Plan  ASA: IV  Anesthesia Plan: General   Post-op Pain Management:    Induction: Intravenous  PONV Risk Score and Plan: 2 and Midazolam and Ondansetron  Airway Management Planned: Double Lumen EBT  Additional Equipment: Arterial line, CVP, PA Cath, TEE, 3D TEE and Ultrasound Guidance Line Placement  Intra-op Plan:   Post-operative Plan: Post-operative intubation/ventilation  Informed Consent: I have reviewed the patients History and Physical, chart, labs and discussed the procedure including the risks, benefits and alternatives for the proposed anesthesia with the patient or authorized representative who has indicated his/her understanding and acceptance.     Dental advisory given  Plan Discussed with: CRNA  Anesthesia Plan Comments:         Anesthesia Quick Evaluation

## 2018-09-05 ENCOUNTER — Other Ambulatory Visit (HOSPITAL_COMMUNITY): Payer: 59

## 2018-09-05 ENCOUNTER — Other Ambulatory Visit: Payer: Self-pay

## 2018-09-05 ENCOUNTER — Inpatient Hospital Stay (HOSPITAL_COMMUNITY)
Admission: RE | Admit: 2018-09-05 | Discharge: 2018-09-10 | DRG: 220 | Disposition: A | Discharge status: Home / Self Care | Payer: 59 | Attending: Thoracic Surgery (Cardiothoracic Vascular Surgery) | Admitting: Thoracic Surgery (Cardiothoracic Vascular Surgery)

## 2018-09-05 ENCOUNTER — Inpatient Hospital Stay (HOSPITAL_COMMUNITY): Payer: 59 | Admitting: Certified Registered Nurse Anesthetist

## 2018-09-05 ENCOUNTER — Encounter (HOSPITAL_COMMUNITY)
Admission: RE | Disposition: A | Discharge status: Home / Self Care | Payer: Self-pay | Source: Home / Self Care | Attending: Thoracic Surgery (Cardiothoracic Vascular Surgery)

## 2018-09-05 ENCOUNTER — Inpatient Hospital Stay (HOSPITAL_COMMUNITY): Payer: 59

## 2018-09-05 ENCOUNTER — Encounter (HOSPITAL_COMMUNITY): Payer: Self-pay

## 2018-09-05 ENCOUNTER — Inpatient Hospital Stay (HOSPITAL_COMMUNITY): Payer: 59 | Admitting: Physician Assistant

## 2018-09-05 DIAGNOSIS — I5032 Chronic diastolic (congestive) heart failure: Secondary | ICD-10-CM | POA: Diagnosis present

## 2018-09-05 DIAGNOSIS — I4892 Unspecified atrial flutter: Secondary | ICD-10-CM | POA: Diagnosis present

## 2018-09-05 DIAGNOSIS — J90 Pleural effusion, not elsewhere classified: Secondary | ICD-10-CM

## 2018-09-05 DIAGNOSIS — Z87891 Personal history of nicotine dependence: Secondary | ICD-10-CM

## 2018-09-05 DIAGNOSIS — J939 Pneumothorax, unspecified: Secondary | ICD-10-CM

## 2018-09-05 DIAGNOSIS — I341 Nonrheumatic mitral (valve) prolapse: Secondary | ICD-10-CM | POA: Diagnosis present

## 2018-09-05 DIAGNOSIS — Z85820 Personal history of malignant melanoma of skin: Secondary | ICD-10-CM | POA: Diagnosis not present

## 2018-09-05 DIAGNOSIS — R7303 Prediabetes: Secondary | ICD-10-CM | POA: Diagnosis present

## 2018-09-05 DIAGNOSIS — D696 Thrombocytopenia, unspecified: Secondary | ICD-10-CM | POA: Diagnosis not present

## 2018-09-05 DIAGNOSIS — D62 Acute posthemorrhagic anemia: Secondary | ICD-10-CM | POA: Diagnosis not present

## 2018-09-05 DIAGNOSIS — J9811 Atelectasis: Secondary | ICD-10-CM

## 2018-09-05 DIAGNOSIS — K5900 Constipation, unspecified: Secondary | ICD-10-CM | POA: Diagnosis present

## 2018-09-05 DIAGNOSIS — I340 Nonrheumatic mitral (valve) insufficiency: Secondary | ICD-10-CM | POA: Diagnosis present

## 2018-09-05 DIAGNOSIS — E785 Hyperlipidemia, unspecified: Secondary | ICD-10-CM | POA: Diagnosis present

## 2018-09-05 DIAGNOSIS — Z8774 Personal history of (corrected) congenital malformations of heart and circulatory system: Secondary | ICD-10-CM

## 2018-09-05 DIAGNOSIS — E8770 Fluid overload, unspecified: Secondary | ICD-10-CM | POA: Diagnosis not present

## 2018-09-05 DIAGNOSIS — Q211 Atrial septal defect: Secondary | ICD-10-CM | POA: Diagnosis not present

## 2018-09-05 DIAGNOSIS — I4891 Unspecified atrial fibrillation: Secondary | ICD-10-CM | POA: Diagnosis present

## 2018-09-05 DIAGNOSIS — I059 Rheumatic mitral valve disease, unspecified: Secondary | ICD-10-CM | POA: Diagnosis present

## 2018-09-05 DIAGNOSIS — Z9889 Other specified postprocedural states: Secondary | ICD-10-CM

## 2018-09-05 HISTORY — DX: Other specified postprocedural states: Z98.890

## 2018-09-05 HISTORY — PX: TEE WITHOUT CARDIOVERSION: SHX5443

## 2018-09-05 HISTORY — DX: Personal history of (corrected) congenital malformations of heart and circulatory system: Z87.74

## 2018-09-05 HISTORY — PX: MITRAL VALVE REPAIR: SHX2039

## 2018-09-05 LAB — POCT I-STAT 4, (NA,K, GLUC, HGB,HCT)
Glucose, Bld: 100 mg/dL — ABNORMAL HIGH (ref 70–99)
Glucose, Bld: 120 mg/dL — ABNORMAL HIGH (ref 70–99)
Glucose, Bld: 133 mg/dL — ABNORMAL HIGH (ref 70–99)
Glucose, Bld: 149 mg/dL — ABNORMAL HIGH (ref 70–99)
Glucose, Bld: 143 mg/dL — ABNORMAL HIGH (ref 70–99)
Glucose, Bld: 121 mg/dL — ABNORMAL HIGH (ref 70–99)
HCT: 33 % — ABNORMAL LOW (ref 39.0–52.0)
HCT: 26 % — ABNORMAL LOW (ref 39.0–52.0)
HCT: 33 % — ABNORMAL LOW (ref 39.0–52.0)
HCT: 26 % — ABNORMAL LOW (ref 39.0–52.0)
HCT: 25 % — ABNORMAL LOW (ref 39.0–52.0)
HCT: 26 % — ABNORMAL LOW (ref 39.0–52.0)
HEMOGLOBIN: 8.8 g/dL — AB (ref 13.0–17.0)
Hemoglobin: 11.2 g/dL — ABNORMAL LOW (ref 13.0–17.0)
Hemoglobin: 11.2 g/dL — ABNORMAL LOW (ref 13.0–17.0)
Hemoglobin: 8.8 g/dL — ABNORMAL LOW (ref 13.0–17.0)
Hemoglobin: 8.5 g/dL — ABNORMAL LOW (ref 13.0–17.0)
Hemoglobin: 8.8 g/dL — ABNORMAL LOW (ref 13.0–17.0)
POTASSIUM: 3.8 mmol/L (ref 3.5–5.1)
Potassium: 4.1 mmol/L (ref 3.5–5.1)
Potassium: 4.3 mmol/L (ref 3.5–5.1)
Potassium: 4.3 mmol/L (ref 3.5–5.1)
Potassium: 5.3 mmol/L — ABNORMAL HIGH (ref 3.5–5.1)
Potassium: 4.8 mmol/L (ref 3.5–5.1)
SODIUM: 140 mmol/L (ref 135–145)
SODIUM: 138 mmol/L (ref 135–145)
Sodium: 140 mmol/L (ref 135–145)
Sodium: 138 mmol/L (ref 135–145)
Sodium: 139 mmol/L (ref 135–145)
Sodium: 140 mmol/L (ref 135–145)

## 2018-09-05 LAB — CBC
HCT: 39.2 % (ref 39.0–52.0)
HEMATOCRIT: 37 % — AB (ref 39.0–52.0)
Hemoglobin: 12.3 g/dL — ABNORMAL LOW (ref 13.0–17.0)
Hemoglobin: 12.8 g/dL — ABNORMAL LOW (ref 13.0–17.0)
MCH: 32 pg (ref 26.0–34.0)
MCH: 31.8 pg (ref 26.0–34.0)
MCHC: 33.2 g/dL (ref 30.0–36.0)
MCHC: 32.7 g/dL (ref 30.0–36.0)
MCV: 96.4 fL (ref 80.0–100.0)
MCV: 97.3 fL (ref 80.0–100.0)
Platelets: 130 10*3/uL — ABNORMAL LOW (ref 150–400)
Platelets: 140 10*3/uL — ABNORMAL LOW (ref 150–400)
RBC: 3.84 MIL/uL — ABNORMAL LOW (ref 4.22–5.81)
RBC: 4.03 MIL/uL — ABNORMAL LOW (ref 4.22–5.81)
RDW: 12.4 % (ref 11.5–15.5)
RDW: 12.3 % (ref 11.5–15.5)
WBC: 19.7 10*3/uL — ABNORMAL HIGH (ref 4.0–10.5)
WBC: 16.2 10*3/uL — ABNORMAL HIGH (ref 4.0–10.5)
nRBC: 0 % (ref 0.0–0.2)
nRBC: 0 % (ref 0.0–0.2)

## 2018-09-05 LAB — POCT I-STAT 7, (LYTES, BLD GAS, ICA,H+H)
ACID-BASE DEFICIT: 1 mmol/L (ref 0.0–2.0)
Acid-base deficit: 5 mmol/L — ABNORMAL HIGH (ref 0.0–2.0)
Acid-base deficit: 7 mmol/L — ABNORMAL HIGH (ref 0.0–2.0)
BICARBONATE: 17.6 mmol/L — AB (ref 20.0–28.0)
Bicarbonate: 24.4 mmol/L (ref 20.0–28.0)
Bicarbonate: 20.8 mmol/L (ref 20.0–28.0)
CALCIUM ION: 1.11 mmol/L — AB (ref 1.15–1.40)
Calcium, Ion: 1.05 mmol/L — ABNORMAL LOW (ref 1.15–1.40)
Calcium, Ion: 1.12 mmol/L — ABNORMAL LOW (ref 1.15–1.40)
HCT: 25 % — ABNORMAL LOW (ref 39.0–52.0)
HCT: 33 % — ABNORMAL LOW (ref 39.0–52.0)
HCT: 36 % — ABNORMAL LOW (ref 39.0–52.0)
Hemoglobin: 8.5 g/dL — ABNORMAL LOW (ref 13.0–17.0)
Hemoglobin: 11.2 g/dL — ABNORMAL LOW (ref 13.0–17.0)
Hemoglobin: 12.2 g/dL — ABNORMAL LOW (ref 13.0–17.0)
O2 Saturation: 100 %
O2 Saturation: 98 %
O2 Saturation: 99 %
PCO2 ART: 43.2 mmHg (ref 32.0–48.0)
PH ART: 7.33 — AB (ref 7.350–7.450)
PO2 ART: 377 mmHg — AB (ref 83.0–108.0)
Patient temperature: 36.1
Patient temperature: 37.1
Potassium: 4.3 mmol/L (ref 3.5–5.1)
Potassium: 3.8 mmol/L (ref 3.5–5.1)
Potassium: 5.1 mmol/L (ref 3.5–5.1)
SODIUM: 135 mmol/L (ref 135–145)
Sodium: 138 mmol/L (ref 135–145)
Sodium: 141 mmol/L (ref 135–145)
TCO2: 26 mmol/L (ref 22–32)
TCO2: 22 mmol/L (ref 22–32)
TCO2: 19 mmol/L — ABNORMAL LOW (ref 22–32)
pCO2 arterial: 39.1 mmHg (ref 32.0–48.0)
pCO2 arterial: 31.4 mmHg — ABNORMAL LOW (ref 32.0–48.0)
pH, Arterial: 7.36 (ref 7.350–7.450)
pH, Arterial: 7.357 (ref 7.350–7.450)
pO2, Arterial: 109 mmHg — ABNORMAL HIGH (ref 83.0–108.0)
pO2, Arterial: 126 mmHg — ABNORMAL HIGH (ref 83.0–108.0)

## 2018-09-05 LAB — HEMOGLOBIN AND HEMATOCRIT, BLOOD
HCT: 26.6 % — ABNORMAL LOW (ref 39.0–52.0)
Hemoglobin: 8.7 g/dL — ABNORMAL LOW (ref 13.0–17.0)

## 2018-09-05 LAB — GLUCOSE, CAPILLARY
Glucose-Capillary: 81 mg/dL (ref 70–99)
Glucose-Capillary: 85 mg/dL (ref 70–99)
Glucose-Capillary: 89 mg/dL (ref 70–99)
Glucose-Capillary: 165 mg/dL — ABNORMAL HIGH (ref 70–99)

## 2018-09-05 LAB — CREATININE, SERUM
Creatinine, Ser: 1.25 mg/dL — ABNORMAL HIGH (ref 0.61–1.24)
GFR calc non Af Amer: >60 mL/min (ref >60)

## 2018-09-05 LAB — PROTIME-INR
INR: 1.31
Prothrombin Time: 16.2 seconds — ABNORMAL HIGH (ref 11.4–15.2)

## 2018-09-05 LAB — PLATELET COUNT: Platelets: 134 10*3/uL — ABNORMAL LOW (ref 150–400)

## 2018-09-05 LAB — HEMOGLOBIN A1C
Hgb A1c MFr Bld: 5.8 % — ABNORMAL HIGH (ref 4.8–5.6)
Mean Plasma Glucose: 120 mg/dL

## 2018-09-05 LAB — APTT: aPTT: 60 seconds — ABNORMAL HIGH (ref 24–36)

## 2018-09-05 LAB — MAGNESIUM: Magnesium: 2.5 mg/dL — ABNORMAL HIGH (ref 1.7–2.4)

## 2018-09-05 SURGERY — REPAIR, MITRAL VALVE, MINIMALLY INVASIVE
Anesthesia: General | Site: Chest | Laterality: Right

## 2018-09-05 MED ORDER — DEXAMETHASONE SODIUM PHOSPHATE 10 MG/ML IJ SOLN
INTRAMUSCULAR | Status: DC | PRN
  Administered 2018-09-05: 10 mg via INTRAVENOUS

## 2018-09-05 MED ORDER — METOPROLOL TARTRATE 5 MG/5ML IV SOLN: 2.5000 mg | INTRAVENOUS | Status: DC | PRN

## 2018-09-05 MED ORDER — BUPIVACAINE 0.5 % ON-Q PUMP SINGLE CATH 400 ML
INJECTION | Status: AC | PRN
  Administered 2018-09-05: 400 mL

## 2018-09-05 MED ORDER — METOPROLOL TARTRATE 12.5 MG HALF TABLET
12.5000 mg | ORAL_TABLET | Freq: Once | ORAL | Status: AC
  Administered 2018-09-05: 12.5 mg via ORAL
  Filled 2018-09-05: qty 1

## 2018-09-05 MED ORDER — SODIUM CHLORIDE 0.9% FLUSH
3.0000 mL | Freq: Two times a day (BID) | INTRAVENOUS | Status: DC
  Administered 2018-09-06: 5 mL via INTRAVENOUS
  Administered 2018-09-06 – 2018-09-10 (×7): 3 mL via INTRAVENOUS

## 2018-09-05 MED ORDER — TRAMADOL HCL 50 MG PO TABS
50.0000 mg | ORAL_TABLET | ORAL | Status: DC | PRN
  Administered 2018-09-06 (×2): 100 mg via ORAL
  Administered 2018-09-06: 50 mg via ORAL
  Administered 2018-09-06 – 2018-09-07 (×3): 100 mg via ORAL
  Filled 2018-09-05 (×3): qty 2
  Filled 2018-09-05: qty 1
  Filled 2018-09-05 (×2): qty 2

## 2018-09-05 MED ORDER — PROTAMINE SULFATE 10 MG/ML IV SOLN
INTRAVENOUS | Status: DC | PRN
  Administered 2018-09-05 (×2): 50 mg via INTRAVENOUS

## 2018-09-05 MED ORDER — DEXMEDETOMIDINE HCL IN NACL 200 MCG/50ML IV SOLN: 0.0000 ug/kg/h | INTRAVENOUS | Status: DC

## 2018-09-05 MED ORDER — ACETAMINOPHEN 500 MG PO TABS
1000.0000 mg | ORAL_TABLET | Freq: Four times a day (QID) | ORAL | Status: DC
  Administered 2018-09-06 – 2018-09-10 (×18): 1000 mg via ORAL
  Filled 2018-09-05 (×18): qty 2

## 2018-09-05 MED ORDER — LIDOCAINE 2% (20 MG/ML) 5 ML SYRINGE
INTRAMUSCULAR | Status: AC
  Filled 2018-09-05: qty 5

## 2018-09-05 MED ORDER — CHLORHEXIDINE GLUCONATE 0.12 % MT SOLN
15.0000 mL | OROMUCOSAL | Status: AC
  Administered 2018-09-05: 15 mL via OROMUCOSAL

## 2018-09-05 MED ORDER — HEPARIN SODIUM (PORCINE) 1000 UNIT/ML IJ SOLN
INTRAMUSCULAR | Status: DC | PRN
  Administered 2018-09-05: 22000 [IU] via INTRAVENOUS

## 2018-09-05 MED ORDER — ALBUMIN HUMAN 5 % IV SOLN: 250.0000 mL | INTRAVENOUS | Status: AC | PRN

## 2018-09-05 MED ORDER — INSULIN REGULAR(HUMAN) IN NACL 100-0.9 UT/100ML-% IV SOLN: INTRAVENOUS | Status: DC

## 2018-09-05 MED ORDER — ORAL CARE MOUTH RINSE
15.0000 mL | Freq: Two times a day (BID) | OROMUCOSAL | Status: DC
  Administered 2018-09-05 – 2018-09-09 (×9): 15 mL via OROMUCOSAL

## 2018-09-05 MED ORDER — SODIUM CHLORIDE 0.9 % IV SOLN
1.5000 g | Freq: Two times a day (BID) | INTRAVENOUS | Status: AC
  Administered 2018-09-05 – 2018-09-07 (×4): 1.5 g via INTRAVENOUS
  Filled 2018-09-05 (×4): qty 1.5

## 2018-09-05 MED ORDER — OXYCODONE HCL 5 MG PO TABS
5.0000 mg | ORAL_TABLET | ORAL | Status: DC | PRN
  Filled 2018-09-05: qty 1

## 2018-09-05 MED ORDER — ROCURONIUM BROMIDE 50 MG/5ML IV SOSY
PREFILLED_SYRINGE | INTRAVENOUS | Status: AC
  Filled 2018-09-05: qty 10

## 2018-09-05 MED ORDER — GLYCOPYRROLATE PF 0.2 MG/ML IJ SOSY
PREFILLED_SYRINGE | INTRAMUSCULAR | Status: DC | PRN
  Administered 2018-09-05: .1 mg via INTRAVENOUS

## 2018-09-05 MED ORDER — MAGNESIUM SULFATE 4 GM/100ML IV SOLN
4.0000 g | Freq: Once | INTRAVENOUS | Status: AC
  Administered 2018-09-05: 4 g via INTRAVENOUS
  Filled 2018-09-05: qty 100

## 2018-09-05 MED ORDER — LACTATED RINGERS IV SOLN
INTRAVENOUS | Status: DC | PRN
  Administered 2018-09-05 (×2): via INTRAVENOUS

## 2018-09-05 MED ORDER — BISACODYL 10 MG RE SUPP: 10.0000 mg | Freq: Every day | RECTAL | Status: DC

## 2018-09-05 MED ORDER — ASPIRIN 81 MG PO CHEW: 324.0000 mg | CHEWABLE_TABLET | Freq: Every day | ORAL | Status: DC

## 2018-09-05 MED ORDER — LACTATED RINGERS IV SOLN: INTRAVENOUS | Status: DC

## 2018-09-05 MED ORDER — 0.9 % SODIUM CHLORIDE (POUR BTL) OPTIME
TOPICAL | Status: DC | PRN
  Administered 2018-09-05: 5000 mL

## 2018-09-05 MED ORDER — CHLORHEXIDINE GLUCONATE 4 % EX LIQD: 30.0000 mL | CUTANEOUS | Status: DC

## 2018-09-05 MED ORDER — PROPOFOL 10 MG/ML IV BOLUS
INTRAVENOUS | Status: DC | PRN
  Administered 2018-09-05: 50 mg via INTRAVENOUS

## 2018-09-05 MED ORDER — ONDANSETRON HCL 4 MG/2ML IJ SOLN
4.0000 mg | Freq: Four times a day (QID) | INTRAMUSCULAR | Status: DC | PRN
  Administered 2018-09-05 – 2018-09-07 (×4): 4 mg via INTRAVENOUS
  Filled 2018-09-05 (×4): qty 2

## 2018-09-05 MED ORDER — INSULIN REGULAR BOLUS VIA INFUSION
0.0000 [IU] | Freq: Three times a day (TID) | INTRAVENOUS | Status: DC
  Filled 2018-09-05: qty 10

## 2018-09-05 MED ORDER — ACETAMINOPHEN 160 MG/5ML PO SOLN: 1000.0000 mg | Freq: Four times a day (QID) | ORAL | Status: DC

## 2018-09-05 MED ORDER — SODIUM CHLORIDE 0.9 % IV SOLN: INTRAVENOUS | Status: DC

## 2018-09-05 MED ORDER — PROPOFOL 10 MG/ML IV BOLUS
INTRAVENOUS | Status: AC
  Filled 2018-09-05: qty 20

## 2018-09-05 MED ORDER — SODIUM CHLORIDE 0.9 % IV SOLN: 250.0000 mL | INTRAVENOUS | Status: DC

## 2018-09-05 MED ORDER — SODIUM CHLORIDE 0.9% FLUSH: 3.0000 mL | INTRAVENOUS | Status: DC | PRN

## 2018-09-05 MED ORDER — EPHEDRINE SULFATE 50 MG/ML IJ SOLN: INTRAMUSCULAR | Status: DC | PRN

## 2018-09-05 MED ORDER — PHENYLEPHRINE HCL-NACL 20-0.9 MG/250ML-% IV SOLN: 0.0000 ug/min | INTRAVENOUS | Status: DC

## 2018-09-05 MED ORDER — ACETAMINOPHEN 650 MG RE SUPP: 650.0000 mg | Freq: Once | RECTAL | Status: AC

## 2018-09-05 MED ORDER — BUPIVACAINE HCL (PF) 0.5 % IJ SOLN
INTRAMUSCULAR | Status: AC
  Filled 2018-09-05: qty 30

## 2018-09-05 MED ORDER — DEXAMETHASONE SODIUM PHOSPHATE 10 MG/ML IJ SOLN
INTRAMUSCULAR | Status: AC
  Filled 2018-09-05: qty 1

## 2018-09-05 MED ORDER — MORPHINE SULFATE (PF) 2 MG/ML IV SOLN
1.0000 mg | INTRAVENOUS | Status: DC | PRN
  Administered 2018-09-05 (×2): 2 mg via INTRAVENOUS
  Filled 2018-09-05 (×2): qty 1

## 2018-09-05 MED ORDER — ROCURONIUM BROMIDE 10 MG/ML (PF) SYRINGE
PREFILLED_SYRINGE | INTRAVENOUS | Status: DC | PRN
  Administered 2018-09-05: 50 mg via INTRAVENOUS

## 2018-09-05 MED ORDER — ACETAMINOPHEN 160 MG/5ML PO SOLN
650.0000 mg | Freq: Once | ORAL | Status: AC
  Administered 2018-09-05: 650 mg

## 2018-09-05 MED ORDER — ONDANSETRON HCL 4 MG/2ML IJ SOLN
INTRAMUSCULAR | Status: AC
  Filled 2018-09-05: qty 2

## 2018-09-05 MED ORDER — CHLORHEXIDINE GLUCONATE 0.12 % MT SOLN
15.0000 mL | Freq: Once | OROMUCOSAL | Status: AC
  Administered 2018-09-05: 15 mL via OROMUCOSAL
  Filled 2018-09-05: qty 15

## 2018-09-05 MED ORDER — SODIUM CHLORIDE 0.45 % IV SOLN: INTRAVENOUS | Status: DC | PRN

## 2018-09-05 MED ORDER — DOCUSATE SODIUM 100 MG PO CAPS
200.0000 mg | ORAL_CAPSULE | Freq: Every day | ORAL | Status: DC
  Administered 2018-09-06 – 2018-09-08 (×3): 200 mg via ORAL
  Filled 2018-09-05 (×5): qty 2

## 2018-09-05 MED ORDER — HEPARIN SODIUM (PORCINE) 1000 UNIT/ML IJ SOLN
INTRAMUSCULAR | Status: AC
  Filled 2018-09-05: qty 1

## 2018-09-05 MED ORDER — INSULIN ASPART 100 UNIT/ML ~~LOC~~ SOLN
0.0000 [IU] | SUBCUTANEOUS | Status: DC
  Administered 2018-09-05 – 2018-09-06 (×2): 4 [IU] via SUBCUTANEOUS
  Administered 2018-09-06 (×4): 2 [IU] via SUBCUTANEOUS

## 2018-09-05 MED ORDER — PANTOPRAZOLE SODIUM 40 MG PO TBEC
40.0000 mg | DELAYED_RELEASE_TABLET | Freq: Every day | ORAL | Status: DC
  Administered 2018-09-07 – 2018-09-10 (×4): 40 mg via ORAL
  Filled 2018-09-05 (×4): qty 1

## 2018-09-05 MED ORDER — ALBUMIN HUMAN 5 % IV SOLN
INTRAVENOUS | Status: DC | PRN
  Administered 2018-09-05: 13:00:00 via INTRAVENOUS

## 2018-09-05 MED ORDER — BISACODYL 5 MG PO TBEC
10.0000 mg | DELAYED_RELEASE_TABLET | Freq: Every day | ORAL | Status: DC
  Administered 2018-09-06 – 2018-09-08 (×3): 10 mg via ORAL
  Filled 2018-09-05 (×5): qty 2

## 2018-09-05 MED ORDER — MIDAZOLAM HCL (PF) 10 MG/2ML IJ SOLN
INTRAMUSCULAR | Status: AC
  Filled 2018-09-05: qty 2

## 2018-09-05 MED ORDER — POTASSIUM CHLORIDE 10 MEQ/50ML IV SOLN
10.0000 meq | INTRAVENOUS | Status: AC
  Administered 2018-09-05 (×3): 10 meq via INTRAVENOUS

## 2018-09-05 MED ORDER — EPHEDRINE SULFATE-NACL 50-0.9 MG/10ML-% IV SOSY
PREFILLED_SYRINGE | INTRAVENOUS | Status: DC | PRN
  Administered 2018-09-05 (×3): 5 mg via INTRAVENOUS

## 2018-09-05 MED ORDER — PROTAMINE SULFATE 10 MG/ML IV SOLN
INTRAVENOUS | Status: AC
  Filled 2018-09-05: qty 25

## 2018-09-05 MED ORDER — FENTANYL CITRATE (PF) 250 MCG/5ML IJ SOLN
INTRAMUSCULAR | Status: DC | PRN
  Administered 2018-09-05: 750 ug via INTRAVENOUS
  Administered 2018-09-05: 100 ug via INTRAVENOUS
  Administered 2018-09-05: 50 ug via INTRAVENOUS
  Administered 2018-09-05: 100 ug via INTRAVENOUS

## 2018-09-05 MED ORDER — ROCURONIUM BROMIDE 100 MG/10ML IV SOLN
INTRAVENOUS | Status: DC | PRN
  Administered 2018-09-05: 30 mg via INTRAVENOUS
  Administered 2018-09-05: 100 mg via INTRAVENOUS

## 2018-09-05 MED ORDER — BUPIVACAINE HCL (PF) 0.5 % IJ SOLN
INTRAMUSCULAR | Status: DC | PRN
  Administered 2018-09-05: 30 mL

## 2018-09-05 MED ORDER — BUPIVACAINE 0.5 % ON-Q PUMP SINGLE CATH 400 ML
400.0000 mL | INJECTION | Status: DC
  Filled 2018-09-05: qty 400

## 2018-09-05 MED ORDER — LACTATED RINGERS IV SOLN: 500.0000 mL | Freq: Once | INTRAVENOUS | Status: DC | PRN

## 2018-09-05 MED ORDER — FENTANYL CITRATE (PF) 250 MCG/5ML IJ SOLN
INTRAMUSCULAR | Status: AC
  Filled 2018-09-05: qty 25

## 2018-09-05 MED ORDER — MIDAZOLAM HCL 2 MG/2ML IJ SOLN: 2.0000 mg | INTRAMUSCULAR | Status: DC | PRN

## 2018-09-05 MED ORDER — FAMOTIDINE IN NACL 20-0.9 MG/50ML-% IV SOLN
20.0000 mg | Freq: Two times a day (BID) | INTRAVENOUS | Status: AC
  Administered 2018-09-05 (×2): 20 mg via INTRAVENOUS
  Filled 2018-09-05: qty 50

## 2018-09-05 MED ORDER — VANCOMYCIN HCL IN DEXTROSE 1-5 GM/200ML-% IV SOLN
1000.0000 mg | Freq: Once | INTRAVENOUS | Status: AC
  Administered 2018-09-05: 1000 mg via INTRAVENOUS
  Filled 2018-09-05: qty 200

## 2018-09-05 MED ORDER — ASPIRIN EC 325 MG PO TBEC: 325.0000 mg | DELAYED_RELEASE_TABLET | Freq: Every day | ORAL | Status: DC

## 2018-09-05 MED ORDER — NITROGLYCERIN IN D5W 200-5 MCG/ML-% IV SOLN: 0.0000 ug/min | INTRAVENOUS | Status: DC

## 2018-09-05 MED ORDER — MIDAZOLAM HCL 5 MG/5ML IJ SOLN
INTRAMUSCULAR | Status: DC | PRN
  Administered 2018-09-05: 1 mg via INTRAVENOUS
  Administered 2018-09-05: 3 mg via INTRAVENOUS
  Administered 2018-09-05: 1 mg via INTRAVENOUS

## 2018-09-05 SURGICAL SUPPLY — 107 items
ADAPTER CARDIO PERF ANTE/RETRO (ADAPTER) ×3 IMPLANT
ADH SKN CLS APL DERMABOND .7 (GAUZE/BANDAGES/DRESSINGS) ×2
ADPR PRFSN 84XANTGRD RTRGD (ADAPTER) ×2
BAG DECANTER FOR FLEXI CONT (MISCELLANEOUS) ×6 IMPLANT
BLADE SURG 11 STRL SS (BLADE) ×3 IMPLANT
BOOT SUTURE AID YELLOW STND (SUTURE) ×3 IMPLANT
CANISTER SUCT 3000ML PPV (MISCELLANEOUS) ×6 IMPLANT
CANNULA FEM VENOUS REMOTE 22FR (CANNULA) ×2 IMPLANT
CANNULA FEMORAL ART 14 SM (MISCELLANEOUS) ×3 IMPLANT
CANNULA GUNDRY RCSP 15FR (MISCELLANEOUS) ×3 IMPLANT
CANNULA OPTISITE PERFUSION 16F (CANNULA) IMPLANT
CANNULA OPTISITE PERFUSION 18F (CANNULA) ×1 IMPLANT
CANNULA SUMP PERICARDIAL (CANNULA) ×6 IMPLANT
CATH KIT ON Q 5IN SLV (PAIN MANAGEMENT) IMPLANT
CATH KIT ON-Q SILVERSOAK 5 (CATHETERS) IMPLANT
CATH KIT ON-Q SILVERSOAK 5IN (CATHETERS) ×3 IMPLANT
CELLS DAT CNTRL 66122 CELL SVR (MISCELLANEOUS) ×2 IMPLANT
CONN ST 1/4X3/8  BEN (MISCELLANEOUS) ×2
CONN ST 1/4X3/8 BEN (MISCELLANEOUS) ×4 IMPLANT
CONNECTOR 1/2X3/8X1/2 3 WAY (MISCELLANEOUS) ×1
CONNECTOR 1/2X3/8X1/2 3WAY (MISCELLANEOUS) ×2 IMPLANT
CONT SPEC 4OZ CLIKSEAL STRL BL (MISCELLANEOUS) ×3 IMPLANT
COVER BACK TABLE 24X17X13 BIG (DRAPES) ×3 IMPLANT
COVER WAND RF STERILE (DRAPES) ×3 IMPLANT
CRADLE DONUT ADULT HEAD (MISCELLANEOUS) ×3 IMPLANT
Chord-X Chordal Sizer ×1 IMPLANT
DERMABOND ADVANCED (GAUZE/BANDAGES/DRESSINGS) ×1
DERMABOND ADVANCED .7 DNX12 (GAUZE/BANDAGES/DRESSINGS) ×4 IMPLANT
DEVICE CLOSURE PERCLS PRGLD 6F (VASCULAR PRODUCTS) ×4 IMPLANT
DEVICE PMI PUNCTURE CLOSURE (MISCELLANEOUS) ×3 IMPLANT
DEVICE SUT CK QUICK LOAD INDV (Prosthesis & Implant Heart) ×3 IMPLANT
DEVICE SUT CK QUICK LOAD MINI (Prosthesis & Implant Heart) ×1 IMPLANT
DEVICE TROCAR PUNCTURE CLOSURE (ENDOMECHANICALS) ×3 IMPLANT
DRAIN CHANNEL 28F RND 3/8 FF (WOUND CARE) ×6 IMPLANT
DRAPE BILATERAL SPLIT (DRAPES) ×3 IMPLANT
DRAPE C-ARM 42X72 X-RAY (DRAPES) ×3 IMPLANT
DRAPE CV SPLIT W-CLR ANES SCRN (DRAPES) ×3 IMPLANT
DRAPE INCISE IOBAN 66X45 STRL (DRAPES) ×9 IMPLANT
DRAPE SLUSH/WARMER DISC (DRAPES) ×3 IMPLANT
DRSG AQUACEL AG ADV 3.5X 6 (GAUZE/BANDAGES/DRESSINGS) ×2 IMPLANT
DRSG COVADERM 4X8 (GAUZE/BANDAGES/DRESSINGS) ×3 IMPLANT
ELECT BLADE 6.5 EXT (BLADE) ×3 IMPLANT
ELECT REM PT RETURN 9FT ADLT (ELECTROSURGICAL) ×6
ELECTRODE REM PT RTRN 9FT ADLT (ELECTROSURGICAL) ×4 IMPLANT
FELT TEFLON 1X6 (MISCELLANEOUS) ×6 IMPLANT
FEMORAL VENOUS CANN RAP (CANNULA) IMPLANT
GAUZE SPONGE 4X4 12PLY STRL LF (GAUZE/BANDAGES/DRESSINGS) ×3 IMPLANT
GLOVE ORTHO TXT STRL SZ7.5 (GLOVE) ×9 IMPLANT
GOWN STRL REUS W/ TWL LRG LVL3 (GOWN DISPOSABLE) ×8 IMPLANT
GOWN STRL REUS W/TWL LRG LVL3 (GOWN DISPOSABLE) ×12
KIT BASIN OR (CUSTOM PROCEDURE TRAY) ×3 IMPLANT
KIT DILATOR VASC 18G NDL (KITS) ×3 IMPLANT
KIT DRAINAGE VACCUM ASSIST (KITS) ×2 IMPLANT
KIT SUCTION CATH 14FR (SUCTIONS) ×5 IMPLANT
KIT SUT CK MINI COMBO 4X17 (Prosthesis & Implant Heart) ×2 IMPLANT
KIT TURNOVER KIT B (KITS) ×3 IMPLANT
LEAD PACING MYOCARDI (MISCELLANEOUS) ×3 IMPLANT
LINE VENT (MISCELLANEOUS) ×2 IMPLANT
NDL AORTIC ROOT 14G 7F (CATHETERS) ×1 IMPLANT
NEEDLE AORTIC ROOT 14G 7F (CATHETERS) ×3 IMPLANT
NS IRRIG 1000ML POUR BTL (IV SOLUTION) ×15 IMPLANT
On-X Chord-X Pre-Measured Loops for Mitral Chordal ×2 IMPLANT
PACK OPEN HEART (CUSTOM PROCEDURE TRAY) ×3 IMPLANT
PAD ARMBOARD 7.5X6 YLW CONV (MISCELLANEOUS) ×6 IMPLANT
PAD ELECT DEFIB RADIOL ZOLL (MISCELLANEOUS) ×3 IMPLANT
PERCLOSE PROGLIDE 6F (VASCULAR PRODUCTS) ×12
RETRACTOR WND ALEXIS 18 MED (MISCELLANEOUS) ×1 IMPLANT
RING MITRAL MEMO 4D 30 (Prosthesis & Implant Heart) ×2 IMPLANT
RTRCTR WOUND ALEXIS 18CM MED (MISCELLANEOUS) ×3
SET CANNULATION TOURNIQUET (MISCELLANEOUS) ×3 IMPLANT
SET CARDIOPLEGIA MPS 5001102 (MISCELLANEOUS) ×1 IMPLANT
SET IRRIG TUBING LAPAROSCOPIC (IRRIGATION / IRRIGATOR) ×3 IMPLANT
SET MICROPUNCTURE 5F STIFF (MISCELLANEOUS) ×1 IMPLANT
SHEATH PINNACLE 6F 10CM (SHEATH) ×1 IMPLANT
SHEATH PINNACLE 8F 10CM (SHEATH) ×2 IMPLANT
SOLUTION ANTI FOG 6CC (MISCELLANEOUS) ×3 IMPLANT
SUT BONE WAX W31G (SUTURE) ×3 IMPLANT
SUT E-PACK MINIMALLY INVASIVE (SUTURE) ×3 IMPLANT
SUT ETHIBOND (SUTURE) ×4 IMPLANT
SUT ETHIBOND 2 0 SH (SUTURE) ×1 IMPLANT
SUT ETHIBOND 2-0 RB-1 WHT (SUTURE) ×2 IMPLANT
SUT ETHIBOND X763 2 0 SH 1 (SUTURE) ×3 IMPLANT
SUT GORETEX CV 4 TH 22 36 (SUTURE) ×3 IMPLANT
SUT GORETEX CV4 TH-18 (SUTURE) ×6 IMPLANT
SUT PROLENE 3 0 SH1 36 (SUTURE) ×12 IMPLANT
SUT PROLENE 4 0 RB 1 (SUTURE) ×3
SUT PROLENE 4-0 RB1 .5 CRCL 36 (SUTURE) ×1 IMPLANT
SUT PROLENE 6 0 C 1 30 (SUTURE) ×4 IMPLANT
SUT SILK 2 0 SH CR/8 (SUTURE) IMPLANT
SUT SILK 3 0 SH CR/8 (SUTURE) IMPLANT
SUT VIC AB 2-0 CTX 36 (SUTURE) IMPLANT
SUT VIC AB 3-0 SH 8-18 (SUTURE) IMPLANT
SUT VICRYL 2 TP 1 (SUTURE) IMPLANT
SYR 10ML LL (SYRINGE) ×3 IMPLANT
SYSTEM SAHARA CHEST DRAIN ATS (WOUND CARE) ×3 IMPLANT
TAPE CLOTH SOFT 2X10 (GAUZE/BANDAGES/DRESSINGS) ×1 IMPLANT
TAPE CLOTH SURG 4X10 WHT LF (GAUZE/BANDAGES/DRESSINGS) ×1 IMPLANT
TOWEL GREEN STERILE (TOWEL DISPOSABLE) ×3 IMPLANT
TOWEL GREEN STERILE FF (TOWEL DISPOSABLE) ×3 IMPLANT
TRAY FOLEY SLVR 16FR TEMP STAT (SET/KITS/TRAYS/PACK) ×3 IMPLANT
TROCAR XCEL BLADELESS 5X75MML (TROCAR) ×3 IMPLANT
TROCAR XCEL NON-BLD 11X100MML (ENDOMECHANICALS) ×6 IMPLANT
TUBE SUCT INTRACARD DLP 20F (MISCELLANEOUS) ×3 IMPLANT
TUNNELER SHEATH ON-Q 11GX8 DSP (PAIN MANAGEMENT) ×1 IMPLANT
UNDERPAD 30X30 (UNDERPADS AND DIAPERS) ×3 IMPLANT
WATER STERILE IRR 1000ML POUR (IV SOLUTION) ×6 IMPLANT
WIRE .035 3MM-J 145CM (WIRE) ×3 IMPLANT

## 2018-09-05 NOTE — Progress Notes (Signed)
Patient interviewed in the preop area. Patient able to confirm name, DOB, procedure, NDKA, no metal in body, npo status and no pain at this time.   Leatha Gilding, RN

## 2018-09-05 NOTE — Brief Op Note (Signed)
09/05/2018  1:50 PM  PATIENT:  John Page  63 y.o. male  PRE-OPERATIVE DIAGNOSIS:  MR  POST-OPERATIVE DIAGNOSIS:  * No post-op diagnosis entered *  PROCEDURE:  Procedure(s):  MINIMALLY INVASIVE MITRAL VALVE REPAIR -Ring Annuloplasty 30 mm Memo 4D Ring -Triangular Resection of Posterior Leaflet P2 -Placement of Gortex Neochords x 3  CLOSURE OF PATENT FORAMEN OVALE  SURGEON:  Surgeon(s) and Role:    * Rexene Alberts, MD - Primary  PHYSICIAN ASSISTANT: Erin Barrett PA-C  ANESTHESIA:   general  EBL: 600 mL  BLOOD ADMINISTERED:CELLSAVER  DRAINS: Right Chest Drains   LOCAL MEDICATIONS USED:  NONE  SPECIMEN:  Source of Specimen:  Portion of Posterior Mitral Valve Leaflet  DISPOSITION OF SPECIMEN:  PATHOLOGY  COUNTS:  YES  TOURNIQUET:  * No tourniquets in log *  DICTATION: .Dragon Dictation  PLAN OF CARE: Admit to inpatient   PATIENT DISPOSITION:  ICU - extubated and stable.   Delay start of Pharmacological VTE agent (>24hrs) due to surgical blood loss or risk of bleeding: yes

## 2018-09-05 NOTE — Anesthesia Procedure Notes (Signed)
Central Venous Catheter Insertion Performed by: Effie Berkshire, MD, anesthesiologist Start/End2/19/2020 7:45 AM, 09/05/2018 7:50 AM Patient location: Pre-op. Preanesthetic checklist: patient identified, IV checked, site marked, risks and benefits discussed, surgical consent, monitors and equipment checked, pre-op evaluation, timeout performed and anesthesia consent Hand hygiene performed  and maximum sterile barriers used  PA cath was placed.Swan type:thermodilution Procedure performed without using ultrasound guided technique. Attempts: 1 Patient tolerated the procedure well with no immediate complications.

## 2018-09-05 NOTE — Anesthesia Procedure Notes (Addendum)
Procedure Name: Intubation Date/Time: 09/05/2018 8:50 AM Performed by: Eligha Bridegroom, CRNA Pre-anesthesia Checklist: Patient identified, Emergency Drugs available, Suction available and Patient being monitored Patient Re-evaluated:Patient Re-evaluated prior to induction Oxygen Delivery Method: Circle System Utilized Preoxygenation: Pre-oxygenation with 100% oxygen Induction Type: IV induction Ventilation: Mask ventilation without difficulty Laryngoscope Size: Mac and 4 Grade View: Grade I Tube type: Oral Endobronchial tube: Left and Double lumen EBT and 39 Fr Number of attempts: 1 Airway Equipment and Method: Stylet and Oral airway Placement Confirmation: ETT inserted through vocal cords under direct vision,  positive ETCO2 and breath sounds checked- equal and bilateral Secured at: 29 cm Tube secured with: Tape Dental Injury: Teeth and Oropharynx as per pre-operative assessment  Comments: DVL and Dual lumen ET tube insertion done by Simeon Craft. Under Eligha Bridegroom CRNA supervision.

## 2018-09-05 NOTE — Progress Notes (Signed)
      [address]       [place] [address]             [phone]      S/p Mitral repair  BP 91/68   Pulse 80   Temp 98.1 F (36.7 C)   Resp 16   Ht 5\' 10"  (1.778 m)   Wt 71.2 kg   SpO2 100%   BMI 22.53 kg/m   Extubated, on 4L [place]  Intake/Output Summary (Last 24 hours) at 09/05/2018 1913 Last data filed at 09/05/2018 1900 Gross per 24 hour  Intake 1613.3 ml  Output 3100 ml  Net -1486.7 ml   Hct 33, minimal CT output  Doing well early postop  Remo Lipps C. Roxan Hockey, MD [facility] [phone]

## 2018-09-05 NOTE — Anesthesia Procedure Notes (Signed)
Arterial Line Insertion Start/End2/19/2020 7:40 AM, 09/05/2018 7:50 AM Performed by: Eligha Bridegroom, CRNA  Patient location: PACU. Preanesthetic checklist: patient identified, IV checked, risks and benefits discussed, monitors and equipment checked and pre-op evaluation Lidocaine 1% used for infiltration and patient sedated Left, radial was placed Catheter size: 20 G Hand hygiene performed , maximum sterile barriers used  and Seldinger technique used Allen's test indicative of satisfactory collateral circulation Attempts: 1 Procedure performed without using ultrasound guided technique. Following insertion, dressing applied and Biopatch. Post procedure assessment: normal  Patient tolerated the procedure well with no immediate complications. Additional procedure comments: Arterial line inserted by Simeon Craft under Supervision of Eligha Bridegroom CRNA. Marland Kitchen

## 2018-09-05 NOTE — Interval H&P Note (Signed)
History and Physical Interval Note:  09/05/2018 6:28 AM  John Page  has presented today for surgery, with the diagnosis of MR  The various methods of treatment have been discussed with the patient and family. After consideration of risks, benefits and other options for treatment, the patient has consented to  Procedure(s): MINIMALLY INVASIVE MITRAL VALVE REPAIR (MVR) (Right) TRANSESOPHAGEAL ECHOCARDIOGRAM (TEE) (N/A) as a surgical intervention .  The patient's history has been reviewed, patient examined, no change in status, stable for surgery.  I have reviewed the patient's chart and labs.  Questions were answered to the patient's satisfaction.     Rexene Alberts

## 2018-09-05 NOTE — Anesthesia Procedure Notes (Signed)
Central Venous Catheter Insertion Performed by: Effie Berkshire, MD, anesthesiologist Start/End2/19/2020 7:30 AM, 09/05/2018 7:40 AM Patient location: Pre-op. Preanesthetic checklist: patient identified, IV checked, site marked, risks and benefits discussed, surgical consent, monitors and equipment checked, pre-op evaluation, timeout performed and anesthesia consent Lidocaine 1% used for infiltration and patient sedated Hand hygiene performed  and maximum sterile barriers used  Catheter size: 8.5 Fr Sheath introducer Procedure performed using ultrasound guided technique. Ultrasound Notes:anatomy identified, needle tip was noted to be adjacent to the nerve/plexus identified, no ultrasound evidence of intravascular and/or intraneural injection and image(s) printed for medical record Attempts: 1 Following insertion, line sutured and dressing applied. Post procedure assessment: blood return through all ports, free fluid flow and no air  Patient tolerated the procedure well with no immediate complications.

## 2018-09-05 NOTE — Transfer of Care (Signed)
Immediate Anesthesia Transfer of Care Note  Patient: John Page  Procedure(s) Performed: MINIMALLY INVASIVE MITRAL VALVE REPAIR (MVR), USING MEMO 4D 30MM AND CLOSURE OF PATENT FORAMEN OVALE (Right Chest) TRANSESOPHAGEAL ECHOCARDIOGRAM (TEE) (N/A )  Patient Location: ICU  Anesthesia Type:General  Level of Consciousness: awake and alert   Airway & Oxygen Therapy: Patient Spontanous Breathing and Patient connected to face mask oxygen  Post-op Assessment: Report given to RN, Post -op Vital signs reviewed and stable and Patient moving all extremities  Post vital signs: Reviewed and stable  Last Vitals:  Vitals Value Taken Time  BP 117/69 09/05/2018  2:48 PM  Temp    Pulse 79 09/05/2018  2:50 PM  Resp 15 09/05/2018  2:50 PM  SpO2 100 % 09/05/2018  2:50 PM  Vitals shown include unvalidated device data.  Last Pain:  Vitals:   09/05/18 0724  TempSrc:   PainSc: 0-No pain         Complications: No apparent anesthesia complications

## 2018-09-05 NOTE — Progress Notes (Signed)
  Echocardiogram 2D Echocardiogram has been performed.  [name] L Androw 09/05/2018, 9:42 AM

## 2018-09-05 NOTE — Op Note (Signed)
CARDIOTHORACIC SURGERY OPERATIVE NOTE  Date of Procedure:  09/05/2018  Preoperative Diagnosis:   Severe Mitral Regurgitation  Patent Foramen Ovale  Postoperative Diagnosis: Same  Procedure:    Minimally-Invasive Mitral Valve Repair  Complex valvuloplasty including triangular resection of flail segment of posterior leaflet  Artificial Gore-tex neochord placement x6  Sorin Memo 4D Ring Annuloplasty (size 8mm, catalog # [id number], serial # [id number])  Closure of patent foramen ovale    Surgeon: Valentina Gu. 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  Patient has been physically active and healthy all of his adult life.  Approximately 5 years ago he was noted to have a heart murmur on physical exam.  Echocardiogram revealed mitral valve prolapse with moderate mitral regurgitation.  He has been followed regularly ever since by Dr. Irish Lack.  Recent follow-up echocardiogram revealed mitral valve prolapse with severe mitral regurgitation.  Left ventricular function remain normal with ejection fraction estimated 60 to 65%.  The patient subsequently underwent TEE and diagnostic cardiac catheterization on August 24, 2018.  TEE confirmed the presence of mitral valve prolapse with a large flail segment involving a portion of the middle scallop of the posterior leaflet.   There were ruptured primary chordae tendinae and severe mitral regurgitation.  No other significant abnormalities were noted.  Diagnostic cardiac catheterization was notable for normal coronary artery anatomy with no significant coronary artery disease.  Right heart pressures were normal.  Angiography of the abdominal aorta and iliac vessels revealed no significant aortoiliac disease.  Cardiothoracic surgical consultation was requested.  The patient has been seen in consultation and counseled at length regarding the indications, risks and potential benefits of surgery.  All questions have been answered, and the patient provides full informed consent for the operation as described.    DETAILS OF THE OPERATIVE PROCEDURE  Preparation:  The patient is brought to the operating room on the above mentioned date and central monitoring was established by the anesthesia team including placement of Swan-Ganz catheter through the left internal jugular vein.  However, after multiple attempts Dr. Ola Spurr was unable to pass a Swan-Ganz catheter into the right atrium.  Ultimately decision was made to proceed without use of pulmonary artery catheter monitoring and use only central venous monitoring.  A radial arterial line is placed. The patient is placed in the supine position on the operating table.  Intravenous antibiotics are administered. General endotracheal anesthesia is induced uneventfully. The patient is initially intubated using a dual lumen endotracheal tube.  A Foley catheter is placed.  Baseline transesophageal echocardiogram was performed.  Findings were notable for myxomatous degenerative disease of the mitral valve with an obvious flail segment involving the middle scallop of the posterior leaflet.  There were multiple ruptured primary chordae tendinae.  There was severe mitral regurgitation.  There was normal left ventricular size and systolic function.  The aortic valve appeared normal.  Right  ventricular size and  function was normal.  There was trace tricuspid regurgitation.  There was a patent foramen ovale.  A soft roll is placed behind the patient's left scapula and the neck gently extended and turned to the left.   The patient's right neck, chest, abdomen, both groins, and both lower extremities are prepared and draped in a sterile manner. A time out procedure is performed.   Percutaneous Vascular Access:  Percutaneous arterial and venous access were obtained on the right side.  Using ultrasound guidance the right common femoral vein was cannulated using the Seldinger technique and single Perclose vascular closure devise was placed, after which time an 8 French sheath inserted.  The right common femoral artery was cannulated using a micropuncture wire and sheath.  A pair of Perclose vascular closure devices were placed at opposing 30 degree angles in the femoral artery, and a 8 French sheath inserted.  The right internal jugular vein was cannulated  using ultrasound guidance and an 8 French sheath inserted.     Surgical Approach:  A right miniature anterolateral thoracotomy incision is performed. The incision is placed just lateral to and superior to the right nipple. The pectoralis major muscle is retracted medially and completely preserved. The right pleural space is entered through the 4th intercostal space. A soft tissue retractor is placed.  Two 11 mm ports are placed through separate stab incisions inferiorly. The right pleural space is insufflated continuously with carbon dioxide gas through the posterior port during the remainder of the operation.  A pledgeted sutures placed through the dome of the right hemidiaphragm and retracted inferiorly to facilitate exposure.  A longitudinal incision is made in the pericardium 3 cm anterior to the phrenic nerve and silk traction sutures are placed on either side of the incision for exposure.   Extracorporeal Cardiopulmonary Bypass and  Myocardial Protection:  The patient is heparinized systemically.  The right common femoral vein is cannulated through the venous sheath and a guidewire advanced into the right atrium using TEE guidance.  The femoral vein cannulated using a 22 Fr long femoral venous cannula.  The right common femoral artery is cannulated through the arterial sheath and a guidewire advanced into the descending thoracic aorta using TEE guidance.  Femoral artery is cannulated with a 18 French femoral arterial cannula.  The right internal jugular vein is cannulated through the venous sheath and a guidewire advanced into the right atrium.  The internal jugular vein is cannulated using a 14 French pediatric femoral venous cannula.   Adequate heparinization is verified.   The entire pre-bypass portion of the operation was notable for stable hemodynamics.  Cardiopulmonary bypass was begun.  Vacuum assist venous drainage is utilized. The incision in the pericardium is extended in both directions. Venous drainage and exposure are notably excellent. A retrograde cardioplegia cannula is placed through the right atrium into the coronary sinus using transesophageal echocardiogram guidance.  An antegrade cardioplegia cannula is placed in the ascending aorta.    The patient is cooled to 32C systemic temperature.  The aortic cross clamp is applied and cardioplegia is delivered initially in an antegrade fashion through the aortic root using modified [place] cold blood cardioplegia ([facility] blood cardioplegia protocol).   The initial cardioplegic arrest is rapid with early diastolic arrest.   Myocardial protection was felt to be excellent.   Closure of Patent Foramen Ovale:  A left atriotomy incision was performed through the interatrial groove and extended partially across the back wall of the left atrium after opening  the oblique sinus inferiorly.  The left atrial surface of the interatrial septum and fossa ovalis are examined and  there is an obvious patent foramen ovale.  The patent foramen ovale was closed using a 2 layer closure of running 4-0 Prolene suture.   Mitral Valve Repair:  The mitral valve is exposed using a self-retaining retractor.  The mitral valve was inspected and notable for fibroelastic deficiency type myxomatous degenerative disease.  There is severe prolapse involving the middle scallop (P2) of the posterior leaflet.  There are multiple ruptured primary chordae tendinae.  The remainder of the valve appears essentially normal.  There is no significant annular calcification.  Interrupted 2-0 Ethibond horizontal mattress sutures are placed circumferentially around the entire mitral valve annulus. The sutures will ultimately be utilized for ring annuloplasty, and at this juncture there are utilized to suspend the valve symmetrically.  The prolapsing segment of the posterior leaflet is repaired using a simple triangular resection and artificial Gore-Tex neo-cords.  A simple triangular resection is performed.  The total resected surface area of P2 is approximately 25%.  The intervening vertical defect is closed using simple everting CV 5 Gore-Tex suture.  Artificial neochord placement was performed using Chord-X multi-strand CV-4 Goretex pre-measured loops.  The appropriate cord length was measured from corresponding normal length primary cords from the P1 segment of the posterior leaflet. The papillary muscle suture of the Chord-X multi-strand suture was placed through the head of the anterior papillary muscle in a horizontal mattress fashion and tied over Teflon felt pledgets. Each of the three pre-measured loops were then reimplanted into the free margin of the P2 segment of the posterior leaflet.    The valve was tested with saline and appeared competent even without ring annuloplasty complete. The valve was sized to a 30 mm annuloplasty ring, based upon the transverse distance between the left and right  commissures and the height of the anterior leaflet, corresponding to a size just slightly larger than the overall surface area of the anterior leaflet.  A Sorin Memo 4D annuloplasty ring (size 65mm, catalog #[id number], serial [id number]) was secured in place uneventfully. All ring sutures were secured using a Cor-knot device.    The valve was tested with saline and appeared competent. There is no residual leak. There was a broad, symmetrical line of coaptation of the anterior and posterior leaflet which was confirmed using the blue ink test.  Rewarming is begun.   Procedure Completion:  The atriotomy was closed using a 2-layer closure of running 3-0 Prolene suture after placing a sump drain across the mitral valve to serve as a left ventricular vent.  One final dose of warm retrograde "reanimation dose" cardioplegia was administered retrograde through the coronary sinus catheter while all air was evacuated through the aortic root.  The aortic cross clamp was removed after a total cross clamp time of 108 minutes.  Epicardial pacing wires are fixed to the inferior wall of the right ventricule and to the right atrial appendage. The patient is rewarmed to 37C temperature. The left ventricular vent is removed.  The patient is ventilated and flow volumes turndown while the mitral valve repair is inspected using transesophageal echocardiogram. The valve repair appears intact with no residual leak. The antegrade cardioplegia cannula is now removed. The patient is weaned and disconnected from cardiopulmonary bypass.  The patient's rhythm at separation from bypass was sinus.  The patient was weaned from bypass without any inotropic support. Total cardiopulmonary bypass time for the operation was  148 minutes.  Followup transesophageal echocardiogram performed after separation from bypass revealed a well-seated annuloplasty ring in the mitral position with a normal functioning mitral valve. There was no residual leak.   Left ventricular function was unchanged from preoperatively.  The mean gradient across the mitral valve was estimated to be 2 mmHg.  The femoral arterial and venous cannulas were removed and all Perclose sutures secured.  Manual pressure was maintained while Protamine was administered.  The right internal jugular cannula was removed and manual pressure held on the neck for 15 minutes.  Single lung ventilation was begun. The atriotomy closure was inspected for hemostasis. The pericardial sac was drained using a 32 French Bard drain placed through the anterior port incision.  The right pleural space is irrigated with saline solution and inspected for hemostasis.   The On-Q pain management system is utilized for postoperative analgesia.  A single lumen catheter is passed through the subcutaneous tissues from the anterior chest wall to the posterior port incision.  The catheter was then passed through the port incision into the pleural space and tunneled into the subpleural space posteriorly to cover the second through the sixth intercostal nerve roots.  The catheter was flushed with 0.5% bupivacaine solution and ultimately connected to a continuous infusion pump.  The right pleural space was drained using a 32 French Bard drain placed through the posterior port incision. The miniature thoracotomy incision was closed in multiple layers in routine fashion. The right groin incision was inspected for hemostasis and closed in multiple layers in routine fashion.  The post-bypass portion of the operation was notable for stable rhythm and hemodynamics.  No blood products were administered during the operation.   Disposition:  The patient tolerated the procedure well.  The patient was extubated in the operating room and subsequently transported to the surgical intensive care unit in stable condition. There were no intraoperative complications. All sponge instrument and needle counts are verified correct at  completion of the operation.     Valentina Gu. Roxy Manns MD 09/05/2018 1:55 PM

## 2018-09-05 NOTE — Anesthesia Postprocedure Evaluation (Signed)
Anesthesia Post Note  Patient: John Page  Procedure(s) Performed: MINIMALLY INVASIVE MITRAL VALVE REPAIR (MVR), USING MEMO 4D 30MM AND CLOSURE OF PATENT FORAMEN OVALE (Right Chest) TRANSESOPHAGEAL ECHOCARDIOGRAM (TEE) (N/A )     Patient location during evaluation: ICU Anesthesia Type: General Level of consciousness: awake and sedated Pain management: pain level controlled Vital Signs Assessment: post-procedure vital signs reviewed and stable Respiratory status: spontaneous breathing, nonlabored ventilation, respiratory function stable and patient connected to nasal cannula oxygen Cardiovascular status: blood pressure returned to baseline and stable Postop Assessment: no apparent nausea or vomiting Anesthetic complications: no    Last Vitals:  Vitals:   09/05/18 0643  BP: (!) 158/92  Pulse: 78  Resp: 20  Temp: 36.5 C  SpO2: 99%    Last Pain:  Vitals:   09/05/18 0724  TempSrc:   PainSc: 0-No pain                 Lowen Barringer,W. EDMOND

## 2018-09-06 ENCOUNTER — Inpatient Hospital Stay (HOSPITAL_COMMUNITY): Payer: 59

## 2018-09-06 LAB — GLUCOSE, CAPILLARY
GLUCOSE-CAPILLARY: 106 mg/dL — AB (ref 70–99)
GLUCOSE-CAPILLARY: 134 mg/dL — AB (ref 70–99)
GLUCOSE-CAPILLARY: 179 mg/dL — AB (ref 70–99)
Glucose-Capillary: 126 mg/dL — ABNORMAL HIGH (ref 70–99)
Glucose-Capillary: 121 mg/dL — ABNORMAL HIGH (ref 70–99)
Glucose-Capillary: 122 mg/dL — ABNORMAL HIGH (ref 70–99)
Glucose-Capillary: 92 mg/dL (ref 70–99)

## 2018-09-06 LAB — BASIC METABOLIC PANEL
Anion gap: 6 (ref 5–15)
Anion gap: 7 (ref 5–15)
BUN: 15 mg/dL (ref 8–23)
BUN: 17 mg/dL (ref 8–23)
CO2: 20 mmol/L — ABNORMAL LOW (ref 22–32)
CO2: 21 mmol/L — ABNORMAL LOW (ref 22–32)
Calcium: 7.6 mg/dL — ABNORMAL LOW (ref 8.9–10.3)
Calcium: 7.8 mg/dL — ABNORMAL LOW (ref 8.9–10.3)
Chloride: 108 mmol/L (ref 98–111)
Chloride: 104 mmol/L (ref 98–111)
Creatinine, Ser: 1.28 mg/dL — ABNORMAL HIGH (ref 0.61–1.24)
Creatinine, Ser: 1.54 mg/dL — ABNORMAL HIGH (ref 0.61–1.24)
GFR calc Af Amer: 54 mL/min — ABNORMAL LOW (ref >60)
GFR calc non Af Amer: 47 mL/min — ABNORMAL LOW (ref >60)
GFR, EST NON AFRICAN AMERICAN: 59 mL/min — AB (ref >60)
Glucose, Bld: 121 mg/dL — ABNORMAL HIGH (ref 70–99)
Glucose, Bld: 136 mg/dL — ABNORMAL HIGH (ref 70–99)
Potassium: 4.5 mmol/L (ref 3.5–5.1)
Potassium: 4 mmol/L (ref 3.5–5.1)
SODIUM: 132 mmol/L — AB (ref 135–145)
Sodium: 134 mmol/L — ABNORMAL LOW (ref 135–145)

## 2018-09-06 LAB — CBC
HCT: 35.1 % — ABNORMAL LOW (ref 39.0–52.0)
HCT: 35.8 % — ABNORMAL LOW (ref 39.0–52.0)
Hemoglobin: 11.9 g/dL — ABNORMAL LOW (ref 13.0–17.0)
Hemoglobin: 11.6 g/dL — ABNORMAL LOW (ref 13.0–17.0)
MCH: 32.6 pg (ref 26.0–34.0)
MCH: 31.9 pg (ref 26.0–34.0)
MCHC: 33.9 g/dL (ref 30.0–36.0)
MCHC: 32.4 g/dL (ref 30.0–36.0)
MCV: 96.2 fL (ref 80.0–100.0)
MCV: 98.4 fL (ref 80.0–100.0)
PLATELETS: 136 10*3/uL — AB (ref 150–400)
Platelets: 145 10*3/uL — ABNORMAL LOW (ref 150–400)
RBC: 3.65 MIL/uL — ABNORMAL LOW (ref 4.22–5.81)
RBC: 3.64 MIL/uL — ABNORMAL LOW (ref 4.22–5.81)
RDW: 12.7 % (ref 11.5–15.5)
RDW: 12.9 % (ref 11.5–15.5)
WBC: 15 10*3/uL — AB (ref 4.0–10.5)
WBC: 15.6 10*3/uL — AB (ref 4.0–10.5)
nRBC: 0 % (ref 0.0–0.2)
nRBC: 0 % (ref 0.0–0.2)

## 2018-09-06 LAB — POCT I-STAT 4, (NA,K, GLUC, HGB,HCT)
Glucose, Bld: 127 mg/dL — ABNORMAL HIGH (ref 70–99)
HCT: 33 % — ABNORMAL LOW (ref 39.0–52.0)
Hemoglobin: 11.2 g/dL — ABNORMAL LOW (ref 13.0–17.0)
Potassium: 4.1 mmol/L (ref 3.5–5.1)
SODIUM: 134 mmol/L — AB (ref 135–145)

## 2018-09-06 LAB — MAGNESIUM
MAGNESIUM: 2 mg/dL (ref 1.7–2.4)
Magnesium: 2.3 mg/dL (ref 1.7–2.4)

## 2018-09-06 MED ORDER — CHLORHEXIDINE GLUCONATE CLOTH 2 % EX PADS
6.0000 | MEDICATED_PAD | Freq: Every day | CUTANEOUS | Status: DC
  Administered 2018-09-06 – 2018-09-08 (×3): 6 via TOPICAL

## 2018-09-06 MED ORDER — SODIUM CHLORIDE 0.9% FLUSH: 10.0000 mL | INTRAVENOUS | Status: DC | PRN

## 2018-09-06 MED ORDER — MOVING RIGHT ALONG BOOK
Freq: Once | Status: AC
  Administered 2018-09-06: 1
  Filled 2018-09-06: qty 1

## 2018-09-06 MED ORDER — WARFARIN - PHYSICIAN DOSING INPATIENT: Freq: Every day | Status: DC

## 2018-09-06 MED ORDER — WARFARIN SODIUM 2.5 MG PO TABS
2.5000 mg | ORAL_TABLET | Freq: Every day | ORAL | Status: DC
  Administered 2018-09-06 – 2018-09-08 (×3): 2.5 mg via ORAL
  Filled 2018-09-06 (×3): qty 1

## 2018-09-06 MED ORDER — PRAVASTATIN SODIUM 40 MG PO TABS
40.0000 mg | ORAL_TABLET | Freq: Every day | ORAL | Status: DC
  Administered 2018-09-08 – 2018-09-10 (×3): 40 mg via ORAL
  Filled 2018-09-06 (×3): qty 1

## 2018-09-06 MED ORDER — SODIUM CHLORIDE 0.9% FLUSH
10.0000 mL | Freq: Two times a day (BID) | INTRAVENOUS | Status: DC
  Administered 2018-09-06: 10 mL
  Administered 2018-09-06: 20 mL
  Administered 2018-09-06 – 2018-09-10 (×7): 10 mL

## 2018-09-06 MED ORDER — ASPIRIN EC 325 MG PO TBEC
325.0000 mg | DELAYED_RELEASE_TABLET | Freq: Every day | ORAL | Status: AC
  Administered 2018-09-06: 325 mg via ORAL
  Filled 2018-09-06: qty 1

## 2018-09-06 MED ORDER — FUROSEMIDE 10 MG/ML IJ SOLN
20.0000 mg | Freq: Once | INTRAMUSCULAR | Status: DC
  Filled 2018-09-06: qty 2

## 2018-09-06 MED ORDER — METOPROLOL TARTRATE 12.5 MG HALF TABLET
12.5000 mg | ORAL_TABLET | Freq: Two times a day (BID) | ORAL | Status: DC
  Administered 2018-09-06 – 2018-09-07 (×3): 12.5 mg via ORAL
  Filled 2018-09-06 (×3): qty 1

## 2018-09-06 MED ORDER — ASPIRIN EC 81 MG PO TBEC
81.0000 mg | DELAYED_RELEASE_TABLET | Freq: Every day | ORAL | Status: DC
  Administered 2018-09-07 – 2018-09-10 (×4): 81 mg via ORAL
  Filled 2018-09-06 (×4): qty 1

## 2018-09-06 MED ORDER — ENOXAPARIN SODIUM 40 MG/0.4ML ~~LOC~~ SOLN
40.0000 mg | Freq: Every day | SUBCUTANEOUS | Status: DC
  Administered 2018-09-07 – 2018-09-09 (×3): 40 mg via SUBCUTANEOUS
  Filled 2018-09-06 (×3): qty 0.4

## 2018-09-06 MED ORDER — FUROSEMIDE 10 MG/ML IJ SOLN
20.0000 mg | Freq: Two times a day (BID) | INTRAMUSCULAR | Status: AC
  Administered 2018-09-06 – 2018-09-07 (×3): 20 mg via INTRAVENOUS
  Filled 2018-09-06 (×2): qty 2

## 2018-09-06 NOTE — Progress Notes (Addendum)
[facility] DAILY ICU PROGRESS NOTE                   [address]            [place], [address]          [phone]   1 Day Post-Op Procedure(s) (LRB): MINIMALLY INVASIVE MITRAL VALVE REPAIR (MVR), USING MEMO 4D 30MM AND CLOSURE OF PATENT FORAMEN OVALE (Right) TRANSESOPHAGEAL ECHOCARDIOGRAM (TEE) (N/A)  Total Length of Stay:  LOS: 1 day   Subjective: Patient had nausea when up to the chair earlier. He has incisional and pain right posterior back.  Objective: Vital signs in last 24 hours: Temp:  [97 F (36.1 C)-101.1 F (38.4 C)] 100.8 F (38.2 C) (02/20 0645) Pulse Rate:  [64-88] 64 (02/20 0645) Cardiac Rhythm: Normal sinus rhythm (02/20 0300) Resp:  [14-34] 20 (02/20 0645) BP: (91-122)/(46-79) 110/66 (02/20 0630) SpO2:  [92 %-100 %] 94 % (02/20 0645) Arterial Line BP: (83-153)/(47-75) 125/53 (02/20 0645) Weight:  [68.6 kg] 68.6 kg (02/20 0500)  Filed Weights   09/05/18 0643 09/06/18 0500  Weight: 71.2 kg 68.6 kg    Weight change: -2.615 kg   Hemodynamic parameters for last 24 hours: CVP:  [0 mmHg-10 mmHg] 7 mmHg  Intake/Output from previous day: 02/19 0701 - 02/20 0700 In: 2490.1 [P.O.:240; I.V.:671.9; Blood:620; IV Piggyback:958.3] Out: 4166 [Urine:3980; Chest Tube:540]  Intake/Output this shift: No intake/output data recorded.  Current Meds: Scheduled Meds: . acetaminophen  1,000 mg Oral Q6H  . aspirin EC  325 mg Oral Daily  . [START ON 09/07/2018] aspirin EC  81 mg Oral Daily  . bisacodyl  10 mg Oral Daily   Or  . bisacodyl  10 mg Rectal Daily  . Chlorhexidine Gluconate Cloth  6 each Topical Daily  . docusate sodium  200 mg Oral Daily  . [START ON 09/07/2018] enoxaparin (LOVENOX) injection  40 mg Subcutaneous QHS  . insulin aspart  0-24 Units Subcutaneous Q4H  . mouth rinse  15 mL Mouth Rinse BID  . [START ON 09/07/2018] pantoprazole  40 mg Oral Daily  . [START ON 09/08/2018] pravastatin  40 mg Oral Daily  . sodium chloride flush  10-40 mL  Intracatheter Q12H  . sodium chloride flush  3 mL Intravenous Q12H  . warfarin  2.5 mg Oral q1800  . Warfarin - Physician Dosing Inpatient   Does not apply q1800   Continuous Infusions: . sodium chloride    . albumin human    . cefUROXime (ZINACEF)  IV 1.5 g (09/06/18 0600)  . lactated ringers    . lactated ringers     PRN Meds:.albumin human, metoprolol tartrate, morphine injection, ondansetron (ZOFRAN) IV, oxyCODONE, sodium chloride flush, sodium chloride flush, traMADol  General appearance: alert, cooperative and no distress Neurologic: intact Heart: RRR Lungs: Slightly diminished basilar breath sounds Abdomen: Soft, non tender, sporadic bowel sounds Extremities: Trace bilateral LE edema Wound: Aquacel intact  Lab Results: CBC: Recent Labs    09/05/18 2150 09/06/18 0520  WBC 16.2* 15.0*  HGB 12.8* 11.9*  HCT 39.2 35.1*  PLT 140* 145*   BMET:  Recent Labs    09/04/18 0927  09/05/18 1240 09/05/18 1332 09/05/18 1507 09/05/18 2102 09/05/18 2150  NA 137   < > 139 140 141 135  --   K 4.2   < > 4.8 3.8 3.8 5.1  --   CL 109  --   --   --   --   --   --  CO2 18*  --   --   --   --   --   --   GLUCOSE 100*   < > 143* 121*  --   --   --   BUN 17  --   --   --   --   --   --   CREATININE 1.32*  --   --   --   --   --  1.25*  CALCIUM 9.7  --   --   --   --   --   --    < > = values in this interval not displayed.    CMET: Lab Results  Component Value Date   WBC 15.0 (H) 09/06/2018   HGB 11.9 (L) 09/06/2018   HCT 35.1 (L) 09/06/2018   PLT 145 (L) 09/06/2018   GLUCOSE 121 (H) 09/05/2018   ALT 33 09/04/2018   AST 25 09/04/2018   NA 135 09/05/2018   K 5.1 09/05/2018   CL 109 09/04/2018   CREATININE 1.25 (H) 09/05/2018   BUN 17 09/04/2018   CO2 18 (L) 09/04/2018   INR 1.31 09/05/2018   HGBA1C 5.8 (H) 09/04/2018      PT/INR:  Recent Labs    09/05/18 1500  LABPROT 16.2*  INR 1.31   Radiology: Dg Chest Port 1 View  Result Date: 09/05/2018 CLINICAL  DATA:  Status post mitral valve repair. EXAM: PORTABLE CHEST 1 VIEW COMPARISON:  09/04/2018 FINDINGS: Left internal jugular central line tip is at the innominate vein level. There has been mitral valve replacement. There is mild bibasilar atelectasis. Upper lungs are well aerated. No edema. No pneumothorax. IMPRESSION: Status post mitral valve replacement. Atelectatic changes in the lower lungs. Electronically Signed   By: Nelson Chimes M.D.   On: 09/05/2018 15:18     Assessment/Plan: S/P Procedure(s) (LRB): MINIMALLY INVASIVE MITRAL VALVE REPAIR (MVR), USING MEMO 4D 30MM AND CLOSURE OF PATENT FORAMEN OVALE (Right) TRANSESOPHAGEAL ECHOCARDIOGRAM (TEE) (N/A)  1. CV-SR in the 60's. Will start low dose Coumadin. 2. Pulmonary-on 2 liters of oxygen via [place]. Chest tubes with 540 cc since surgery. CXR this am appears to show bibasilar atelectasis. Chest tubes to remain to suction for now. Encourage incentive spirometer. 3. Mild volume overload-he is diuresing on his own and with creatinine slightly elevated, will not give Lasix 4. ABL anemia-H and H 11.96 and 35.1 5. Fever max to 101.1-100.4 this am. WBC this am 15,000. Likely a combination of atelectasis and SIRS.  6. CBGs 165/179/126. Pre op HGA1C 5.8. He likely has pre diabetes. Will stop accu checks and SS once tolerating oral better.  7. K pad PRN right back/shoulder 8. GI-monitor nausea. May be from anesthetics or pain medication. 9. Please see progression orders   Donielle Liston Alba PA-C 09/06/2018 7:11 AM    I have seen and examined the patient and agree with the assessment and plan as outlined.  Doing very well POD1  Rexene Alberts, MD 09/06/2018 10:00 AM

## 2018-09-06 NOTE — Progress Notes (Signed)
CT surgery p.m. Rounds  Sitting up in chair breathing comfortably on room air Normal sinus rhythm Minimal incisional pain P.m. labs satisfactory-creatinine 1.5

## 2018-09-06 NOTE — Discharge Planning (Addendum)
Physician Discharge Summary       [address]       [place] [address]             [phone]    Patient ID: John Page MRN: [id number] DOB/AGE: Aug 21, 1955 63 y.o.  Admit date: 09/05/2018 Discharge date: 09/10/2018  Admission Diagnoses: 1. Severe mitral regurgitation 2. Patent foramen ovale  Discharge Diagnoses:  1. S/P minimally invasive mitral valve repair 2. S/P patent foramen ovale closure 3. History of melanoma ([place]) 4. History of hyperlipidemia   Procedure (s):   Minimally-Invasive Mitral Valve Repair             Complex valvuloplasty including triangular resection of flail segment of posterior leaflet             Artificial Gore-tex neochord placement x6             Sorin Memo 4D Ring Annuloplasty (size 44mm, catalog # [id number], serial # [id number])             Closure of patent foramen ovale by Dr. Roxy Manns on 09/05/2018  History of Presenting Illness: Patient has been physically active and healthy all of his adult life.  Approximately 5 years ago he was noted to have a heart murmur on physical exam.  Echocardiogram revealed mitral valve prolapse with moderate mitral regurgitation.  He has been followed regularly ever since by Dr. Irish Lack.  Recent follow-up echocardiogram revealed mitral valve prolapse with severe mitral regurgitation.  Left ventricular function remain normal with ejection fraction estimated 60 to 65%.  The patient subsequently underwent TEE and diagnostic cardiac catheterization on August 24, 2018.  TEE confirmed the presence of mitral valve prolapse with a large flail segment involving a portion of the middle scallop of the posterior leaflet.  There were ruptured primary chordae tendinae and severe mitral regurgitation.  No other significant abnormalities were noted.  Diagnostic cardiac catheterization was notable for normal coronary artery anatomy with no significant coronary artery disease.  Right heart pressures were normal.  Angiography  of the abdominal aorta and iliac vessels revealed no significant aortoiliac disease.  Cardiothoracic surgical consultation was requested.  Patient is single and lives alone locally in [place].  He is retired from the [employer].  He currently works part-time at [employer].  He walks on a regular basis although he admits that for the past year or so he has not been doing any other sort of exercise.  He used to exercise more regularly until he underwent inguinal hernia repair.  He has noticed that over the past year he has developed some symptoms of exertional shortness of breath.  Symptoms occur with moderate level activity.  He also gets short of breath if he lays flat in bed.  He denies any history of chest pain or chest tightness with exertion.  He has not had palpitations, dizzy spells, nor syncope.  He denies any history of PND or lower extremity edema. The patient was counseled at length regarding the indications, risks and potential benefits of mitral valve repair.  The rationale for elective surgery has been explained, including a comparison between surgery and continued medical therapy with close follow-up.  The likelihood of successful and durable valve repair has been discussed with particular reference to the findings of their recent echocardiogram.  Based upon these findings and previous experience, I have quoted them a greater than 95 percent likelihood of successful valve repair.  Alternative surgical approaches have been  discussed including a comparison between conventional sternotomy and minimally-invasive techniques. The relative risks and benefits of each have been reviewed as they pertain to the patient's specific circumstances, and all of their questions have been addressed.  Expectations for his postoperative convalescence have been discussed.  The patient understands and accepts all potential risks of surgery. He was admitted on 02/19 in order to undergo  minimally invasive mitral valve repair.  Brief Hospital Course:  The patient was extubated in the OR after surgery without difficulty. He remained afebrile and hemodynamically stable. Gordy Councilman, a line, and foley were removed early in the post operative course. Chest tubes remained for a few days after output decreased, were removed on 02/23. Low dose Lopressor was started . He was started on Coumadin and daily PT/INR were monitored. He was volume over loaded and diuresed. He had ABL anemia. He did not require a post op transfusion. Last H and H was 11.8 and 34.8. He was weaned off the insulin drip. The patient's HGA1C pre op was  5.8. He likely has pre diabetes. Nutrition recommendations were given to him and he will need further surveillance with his medical doctor after discharge.The patient was felt surgically stable for transfer from the ICU to PCTU for further convalescence on 02/21. He continues to progress with cardiac rehab. He was ambulating on room air. He has been tolerating a diet and has had a bowel movement. Regarding his rhythm, it appears to be sinus rhythm with competing accelerated junctional. He has been asymptomatic even when mildly bradycardic. He has also NOT been on a beta blocker. Epicardial pacing wires were removed on 02/24 . As of the day of discharge, his INR is 1.08  and he will be discharged on 5 mg of Coumadin. He has a PT/INR appointment for Wednesday 02/26. Chest tube sutures will be removed in the office after discharge. As discussed with Dr. Roxy Manns, the patient is felt surgically stable for discharge today.   Latest Vital Signs: Blood pressure 129/81, pulse 77, temperature 97.9 F (36.6 C), temperature source Oral, resp. rate (!) 25, height 5\' 10"  (1.778 m), weight 69.8 kg, SpO2 97 %.  Physical Exam: Cardiovascular: RRR Pulmonary: Slightly diminished at right base  Abdomen: Soft, non tender, bowel sounds present. Extremities: No lower extremity edema. Wounds: Clean  and dry.  No erythema or signs of infection. Chest tube site dressing with minor sero sanguinous ooze. Dressing was changed by myself  Discharge Condition: Stable and discharged to home.  Recent laboratory studies:  Lab Results  Component Value Date   WBC 14.1 (H) 09/08/2018   HGB 11.8 (L) 09/08/2018   HCT 34.8 (L) 09/08/2018   MCV 96.4 09/08/2018   PLT 128 (L) 09/08/2018   Lab Results  Component Value Date   NA 136 09/09/2018   K 3.7 09/09/2018   CL 102 09/09/2018   CO2 23 09/09/2018   CREATININE 1.47 (H) 09/09/2018   GLUCOSE 114 (H) 09/09/2018    Diagnostic Studies:   EXAM: CHEST - 2 VIEW  COMPARISON:  09/07/2018  FINDINGS: Cardiac shadow is stable. Postsurgical changes are again seen. Left jugular central line is been removed in the interval. Right-sided chest tubes have been removed. No significant recurrent pneumothorax is noted. Minimal right basilar atelectasis is again noted. No bony abnormality is seen.  IMPRESSION: No significant recurrent pneumothorax following chest tube removal on the right.   Electronically Signed   By: Inez Catalina M.D.   On: 09/10/2018 12:55   Vas  US Doppler Pre Cabg  Result Date: 09/04/2018 PREOPERATIVE VASCULAR EVALUATION  Indications: Pre op mvr. Performing Technologist: Birdena Crandall, [place] [facility]  Examination Guidelines: A complete evaluation includes B-mode imaging, spectral Doppler, color Doppler, and power Doppler as needed of all accessible portions of each vessel. Bilateral testing is considered an integral part of a complete examination. Limited examinations for reoccurring indications may be performed as noted.  Right Carotid Findings: +----------+--------+--------+--------+--------+--------------------+           PSV cm/sEDV cm/sStenosisDescribeComments             +----------+--------+--------+--------+--------+--------------------+ CCA Prox  83      16                      mild intimal changes  +----------+--------+--------+--------+--------+--------------------+ CCA Distal71      21                      mild intimal changes +----------+--------+--------+--------+--------+--------------------+ ICA Prox  41      8                       mild intimal changes +----------+--------+--------+--------+--------+--------------------+ ICA Mid   87      20                                           +----------+--------+--------+--------+--------+--------------------+ ICA Distal61      25                                           +----------+--------+--------+--------+--------+--------------------+ ECA       100     17                      mild intimal changes +----------+--------+--------+--------+--------+--------------------+ Portions of this table do not appear on this page. +----------+--------+-------+--------+------------+           PSV cm/sEDV cmsDescribeArm Pressure +----------+--------+-------+--------+------------+ Subclavian83                     143          +----------+--------+-------+--------+------------+ +---------+--------+--+--------+-+ VertebralPSV cm/s24EDV cm/s6 +---------+--------+--+--------+-+ Left Carotid Findings: +----------+-------+--------+--------+---------------------+-------------------+           PSV    EDV cm/sStenosisDescribe             Comments                      cm/s                                                            +----------+-------+--------+--------+---------------------+-------------------+ CCA Prox  88     13                                   mild intimal  changes             +----------+-------+--------+--------+---------------------+-------------------+ CCA Distal85     21                                   mild intimal                                                              changes              +----------+-------+--------+--------+---------------------+-------------------+ ICA Prox  54     15              focal and            minimal plaque                                       heterogenous                             +----------+-------+--------+--------+---------------------+-------------------+ ICA Mid   68     21                                                       +----------+-------+--------+--------+---------------------+-------------------+ ICA Distal70     24                                                       +----------+-------+--------+--------+---------------------+-------------------+ ECA       99     16                                   mild intimal                                                              changes             +----------+-------+--------+--------+---------------------+-------------------+ +----------+--------+--------+--------+------------+ SubclavianPSV cm/sEDV cm/sDescribeArm Pressure +----------+--------+--------+--------+------------+           91                      146          +----------+--------+--------+--------+------------+ +---------+--------+--+--------+--+ VertebralPSV cm/s40EDV cm/s13 +---------+--------+--+--------+--+  ABI Findings: +--------+------------------+-----+---------+--------+ Right   Rt Pressure (mmHg)IndexWaveform Comment  +--------+------------------+-----+---------+--------+ [id number]                    triphasic         +--------+------------------+-----+---------+--------+ +--------+------------------+-----+---------+-------+ Left    Lt Pressure (mmHg)IndexWaveform Comment +--------+------------------+-----+---------+-------+ [id number]  triphasic        +--------+------------------+-----+---------+-------+  Right Doppler Findings: +-----------+--------+-----+---------+--------+ Site       PressureIndexDoppler  Comments  +-----------+--------+-----+---------+--------+ Brachial   143          triphasic         +-----------+--------+-----+---------+--------+ Radial                  triphasic         +-----------+--------+-----+---------+--------+ Ulnar                   triphasic         +-----------+--------+-----+---------+--------+ Palmar Arch                      normal   +-----------+--------+-----+---------+--------+  Left Doppler Findings: +-----------+--------+-----+---------+--------+ Site       PressureIndexDoppler  Comments +-----------+--------+-----+---------+--------+ Brachial   146          triphasic         +-----------+--------+-----+---------+--------+ Radial                  triphasic         +-----------+--------+-----+---------+--------+ Ulnar                   triphasic         +-----------+--------+-----+---------+--------+ Palmar Arch                      normal   +-----------+--------+-----+---------+--------+  Summary: Right Carotid: There is no evidence of stenosis in the right ICA. Left Carotid: Velocities in the left ICA are consistent with a 1-39% stenosis. Vertebrals:  Bilateral vertebral arteries demonstrate antegrade flow. Subclavians: Normal flow hemodynamics were seen in bilateral subclavian              arteries. Right Upper Extremity: Doppler waveforms remain within normal limits with right radial compression. Doppler waveforms remain within normal limits with right ulnar compression. Left Upper Extremity: Doppler waveforms remain within normal limits with left radial compression. Doppler waveforms remain within normal limits with left ulnar compression.  Electronically signed by Deitra Mayo MD on 09/04/2018 at 2:48:47 PM.    Final     Discharge Instructions    Amb Referral to Cardiac Rehabilitation   Complete by:  As directed    Diagnosis:  Valve Repair   Valve:  Mitral      Discharge Medications: Allergies as of 09/10/2018     No Known Allergies     Medication List    TAKE these medications   acetaminophen 500 MG tablet Commonly known as:  TYLENOL Take 1,000 mg by mouth every 6 (six) hours as needed for moderate pain or headache.   amoxicillin 500 MG capsule Commonly known as:  AMOXIL 4 CAPSULES 1 HOUR PRIOR TO DENTAL What changed:  See the new instructions.   aspirin 81 MG EC tablet Take 1 tablet (81 mg total) by mouth daily. Start taking on:  September 11, 2018   oxyCODONE 5 MG immediate release tablet Commonly known as:  Oxy IR/ROXICODONE Take 1 tablet (5 mg total) by mouth every 4 (four) hours as needed for severe pain.   pravastatin 40 MG tablet Commonly known as:  PRAVACHOL Take 40 mg by mouth daily.   warfarin 5 MG tablet Commonly known as:  COUMADIN Take 1 tablet (5 mg total) by mouth daily at 6 PM. Or as directed      The patient has been discharged on:  1.Beta Blocker:  Yes [  ]                              No   [  x ]                              If No, reason:Junctional, bradycardia  2.Ace Inhibitor/ARB: Yes [   ]                                     No  [  x  ]                                     If No, reason:Labile BP  3.Statin:   Yes [ x  ]                  No  [   ]                  If No, reason:  4.Ecasa:  Yes  [ x  ]                  No   [   ]                  If No, reason:  Follow Up Appointments: Follow-up Information    Rexene Alberts, MD. Go on 10/01/2018.   Specialty:  Cardiothoracic Surgery Why:  PA/LAT CXR (to be taken at [facility] which is in the same building as Dr. Guy Sandifer office) on 03/16 at 12:30 pm;Appointment time is at 1:00 pm Contact information: [address] [place] [address] [place] Office. Go on 09/12/2018.   Specialty:  Cardiology Why:  Appointment is for PT/INR (on Coumadin for MV repair) to be drawn. Appointment time is at 2:30 pm Contact information: [address] [place]       Lyda Jester M, [place]. Go on 09/25/2018.   Specialties:  Cardiology, Radiology Why:  Appointment time is at 11:00 am Contact information: [place] [address] [phone]        Nurse. Go on 09/21/2018.   Why:  Appointment is with nurse only to have chest sutures removed. Appointment time is at 10:00 am Contact information: [place] [address] [place]  [address]       Maury Dus, MD. Call.   Specialty:  Family Medicine Why:  for a follow up appointment regarding further surveillance of HGA1C 5.8 (pre diabetes) Contact information: [place] [address] [phone]           Signed: Sharalyn Ink [facility] 09/10/2018, 1:15 PM

## 2018-09-07 ENCOUNTER — Inpatient Hospital Stay (HOSPITAL_COMMUNITY): Payer: 59

## 2018-09-07 ENCOUNTER — Encounter (HOSPITAL_COMMUNITY): Payer: Self-pay | Admitting: Thoracic Surgery (Cardiothoracic Vascular Surgery)

## 2018-09-07 LAB — BASIC METABOLIC PANEL
Anion gap: 8 (ref 5–15)
BUN: 15 mg/dL (ref 8–23)
CHLORIDE: 104 mmol/L (ref 98–111)
CO2: 22 mmol/L (ref 22–32)
CREATININE: 1.43 mg/dL — AB (ref 0.61–1.24)
Calcium: 8.4 mg/dL — ABNORMAL LOW (ref 8.9–10.3)
GFR calc Af Amer: 60 mL/min — ABNORMAL LOW (ref >60)
GFR calc non Af Amer: 51 mL/min — ABNORMAL LOW (ref >60)
Glucose, Bld: 110 mg/dL — ABNORMAL HIGH (ref 70–99)
Potassium: 4.2 mmol/L (ref 3.5–5.1)
Sodium: 134 mmol/L — ABNORMAL LOW (ref 135–145)

## 2018-09-07 LAB — CBC
HCT: 35.2 % — ABNORMAL LOW (ref 39.0–52.0)
Hemoglobin: 11.6 g/dL — ABNORMAL LOW (ref 13.0–17.0)
MCH: 32.2 pg (ref 26.0–34.0)
MCHC: 33 g/dL (ref 30.0–36.0)
MCV: 97.8 fL (ref 80.0–100.0)
Platelets: 130 10*3/uL — ABNORMAL LOW (ref 150–400)
RBC: 3.6 MIL/uL — ABNORMAL LOW (ref 4.22–5.81)
RDW: 12.8 % (ref 11.5–15.5)
WBC: 15.4 10*3/uL — ABNORMAL HIGH (ref 4.0–10.5)
nRBC: 0 % (ref 0.0–0.2)

## 2018-09-07 LAB — GLUCOSE, CAPILLARY
Glucose-Capillary: 104 mg/dL — ABNORMAL HIGH (ref 70–99)
Glucose-Capillary: 101 mg/dL — ABNORMAL HIGH (ref 70–99)

## 2018-09-07 LAB — PROTIME-INR
INR: 1.18
Prothrombin Time: 14.9 seconds (ref 11.4–15.2)

## 2018-09-07 MED ORDER — COUMADIN BOOK
Freq: Once | Status: AC
  Administered 2018-09-07: 1
  Filled 2018-09-07: qty 1

## 2018-09-07 MED ORDER — BIOTENE DRY MOUTH MT LIQD: 15.0000 mL | OROMUCOSAL | Status: DC | PRN

## 2018-09-07 NOTE — Progress Notes (Signed)
Report called to receiving [id number]. Patient with no complaints at the current time. Will transfer with RN in [facility].

## 2018-09-07 NOTE — Progress Notes (Addendum)
TCTS DAILY ICU PROGRESS NOTE                   [address]            [place] [address]          [phone]   2 Days Post-Op Procedure(s) (LRB): MINIMALLY INVASIVE MITRAL VALVE REPAIR (MVR), USING MEMO 4D 30MM AND CLOSURE OF PATENT FORAMEN OVALE (Right) TRANSESOPHAGEAL ECHOCARDIOGRAM (TEE) (N/A)  Total Length of Stay:  LOS: 2 days   Subjective: Patient had nausea when first gets up in the morning but he is feeling better now. His mouth is dry.  Objective: Vital signs in last 24 hours: Temp:  [97.4 F (36.3 C)-99.9 F (37.7 C)] 98.7 F (37.1 C) (02/21 0400) Pulse Rate:  [58-88] 61 (02/21 0500) Cardiac Rhythm: Normal sinus rhythm (02/21 0600) Resp:  [15-37] 34 (02/21 0600) BP: (105-144)/(62-95) 105/88 (02/21 0600) SpO2:  [92 %-98 %] 94 % (02/21 0500) Arterial Line BP: (119-125)/(52-54) 125/54 (02/20 1000) Weight:  [73.2 kg] 73.2 kg (02/21 0500)  Filed Weights   09/05/18 0643 09/06/18 0500 09/07/18 0500  Weight: 71.2 kg 68.6 kg 73.2 kg    Weight change: 4.6 kg   Hemodynamic parameters for last 24 hours: CVP:  [4 mmHg-12 mmHg] 10 mmHg  Intake/Output from previous day: 02/20 0701 - 02/21 0700 In: 892.4 [P.O.:720; I.V.:89; IV Piggyback:83.4] Out: 2325 [Urine:2015; Chest Tube:310]  Intake/Output this shift: No intake/output data recorded.  Current Meds: Scheduled Meds: . acetaminophen  1,000 mg Oral Q6H  . aspirin EC  81 mg Oral Daily  . bisacodyl  10 mg Oral Daily   Or  . bisacodyl  10 mg Rectal Daily  . Chlorhexidine Gluconate Cloth  6 each Topical Daily  . docusate sodium  200 mg Oral Daily  . enoxaparin (LOVENOX) injection  40 mg Subcutaneous QHS  . furosemide  20 mg Intravenous Once  . furosemide  20 mg Intravenous BID  . mouth rinse  15 mL Mouth Rinse BID  . metoprolol tartrate  12.5 mg Oral BID  . pantoprazole  40 mg Oral Daily  . [START ON 09/08/2018] pravastatin  40 mg Oral Daily  . sodium chloride flush  10-40 mL Intracatheter  Q12H  . sodium chloride flush  3 mL Intravenous Q12H  . warfarin  2.5 mg Oral q1800  . Warfarin - Physician Dosing Inpatient   Does not apply q1800   Continuous Infusions: . sodium chloride Stopped (09/06/18 1737)  . lactated ringers     PRN Meds:.metoprolol tartrate, morphine injection, ondansetron (ZOFRAN) IV, oxyCODONE, sodium chloride flush, sodium chloride flush, traMADol  General appearance: alert, cooperative and no distress Neurologic: intact Heart: RRR Lungs: Slightly diminished basilar breath sounds Abdomen: Soft, non tender, sporadic bowel sounds Extremities: Trace bilateral LE edema Wound: Aquacel intact  Lab Results: CBC: Recent Labs    09/06/18 1823 09/07/18 0407  WBC 15.6* 15.4*  HGB 11.6* 11.6*  HCT 35.8* 35.2*  PLT 136* 130*   BMET:  Recent Labs    09/06/18 1823 09/07/18 0407  NA 132* 134*  K 4.0 4.2  CL 104 104  CO2 21* 22  GLUCOSE 136* 110*  BUN 17 15  CREATININE 1.54* 1.43*  CALCIUM 7.8* 8.4*    CMET: Lab Results  Component Value Date   WBC 15.4 (H) 09/07/2018   HGB 11.6 (L) 09/07/2018   HCT 35.2 (L) 09/07/2018   PLT 130 (L) 09/07/2018   GLUCOSE 110 (H) 09/07/2018  ALT 33 09/04/2018   AST 25 09/04/2018   NA 134 (L) 09/07/2018   K 4.2 09/07/2018   CL 104 09/07/2018   CREATININE 1.43 (H) 09/07/2018   BUN 15 09/07/2018   CO2 22 09/07/2018   INR 1.18 09/07/2018   HGBA1C 5.8 (H) 09/04/2018      PT/INR:  Recent Labs    09/07/18 0407  LABPROT 14.9  INR 1.18   Radiology: Dg Chest Port 1 View  Result Date: 09/07/2018 CLINICAL DATA:  Chest tube.  Sore chest. EXAM: PORTABLE CHEST 1 VIEW COMPARISON:  09/06/2018.  09/05/2018. FINDINGS: Right chest tube is been straight, its tip is over the right apex. No definite pneumothorax noted. Heart size stable. No pulmonary venous congestion. Persistent bibasilar atelectasis. No pleural effusion. IMPRESSION: 1. Right chest tube is been straightening. Its tip is over the right apex. No definite  pneumothorax. Left IJ line stable position. 2.  Persistent bibasilar atelectasis. Electronically Signed   By: Marcello Moores  Register   On: 09/07/2018 06:39     Assessment/Plan: S/P Procedure(s) (LRB): MINIMALLY INVASIVE MITRAL VALVE REPAIR (MVR), USING MEMO 4D 30MM AND CLOSURE OF PATENT FORAMEN OVALE (Right) TRANSESOPHAGEAL ECHOCARDIOGRAM (TEE) (N/A)  1. CV-SR in the 60's. On Lopressor 12.5 mg bid and Coumadin. INR this am 1.18. Continue with current dose of Coumadin. 2. Pulmonary-on room air. Chest tubes with 310 cc since surgery. CXR this am appears to show bibasilar atelectasis and no pneumothorax. Chest tubes to remain to suction for now. Encourage incentive spirometer and flutter valve 3. Mild volume overload-on Lasix 20 mg IV  4. ABL anemia-H and H this am stable at 11.6 and 35.2 5. CBGs 122/92/104. Pre op HGA1C 5.8. He likely has pre diabetes. Will stop accu checks and SS   7. Mild thrombocytopenia-platelets 130,000 this am 8. Remove sleeve 9. Biotene for dry mouth 10. Creatinine slightly increased to 1.43. On low dose Lasix. Monitor as may need to stop 11. Likely transfer to 4E  Donielle Liston Alba PA-C 09/07/2018 7:23 AM     I have seen and examined the patient and agree with the assessment and plan as outlined.  Doing well.  May need to cut back dose or d/c metoprolol if HR doesn't improve.  Mobilize.   Leave chest tubes in 1 more day.  Transfer 4E.  Possibly ready for d/c home by Sunday or Monday.  Rexene Alberts, MD 09/07/2018 8:19 AM

## 2018-09-08 LAB — BASIC METABOLIC PANEL
Anion gap: 12 (ref 5–15)
BUN: 15 mg/dL (ref 8–23)
CHLORIDE: 100 mmol/L (ref 98–111)
CO2: 24 mmol/L (ref 22–32)
Calcium: 8.7 mg/dL — ABNORMAL LOW (ref 8.9–10.3)
Creatinine, Ser: 1.51 mg/dL — ABNORMAL HIGH (ref 0.61–1.24)
GFR calc Af Amer: 56 mL/min — ABNORMAL LOW (ref >60)
GFR calc non Af Amer: 48 mL/min — ABNORMAL LOW (ref >60)
Glucose, Bld: 107 mg/dL — ABNORMAL HIGH (ref 70–99)
Potassium: 3.8 mmol/L (ref 3.5–5.1)
Sodium: 136 mmol/L (ref 135–145)

## 2018-09-08 LAB — CBC
HCT: 34.8 % — ABNORMAL LOW (ref 39.0–52.0)
Hemoglobin: 11.8 g/dL — ABNORMAL LOW (ref 13.0–17.0)
MCH: 32.7 pg (ref 26.0–34.0)
MCHC: 33.9 g/dL (ref 30.0–36.0)
MCV: 96.4 fL (ref 80.0–100.0)
Platelets: 128 10*3/uL — ABNORMAL LOW (ref 150–400)
RBC: 3.61 MIL/uL — ABNORMAL LOW (ref 4.22–5.81)
RDW: 12.6 % (ref 11.5–15.5)
WBC: 14.1 10*3/uL — ABNORMAL HIGH (ref 4.0–10.5)
nRBC: 0 % (ref 0.0–0.2)

## 2018-09-08 LAB — PROTIME-INR
INR: 1.14
Prothrombin Time: 14.5 seconds (ref 11.4–15.2)

## 2018-09-08 MED ORDER — LACTULOSE 10 GM/15ML PO SOLN
20.0000 g | Freq: Every day | ORAL | Status: DC | PRN
  Administered 2018-09-08: 20 g via ORAL
  Filled 2018-09-08: qty 30

## 2018-09-08 MED ORDER — POTASSIUM CHLORIDE CRYS ER 20 MEQ PO TBCR
40.0000 meq | EXTENDED_RELEASE_TABLET | Freq: Once | ORAL | Status: AC
  Administered 2018-09-08: 40 meq via ORAL
  Filled 2018-09-08: qty 2

## 2018-09-08 MED ORDER — MAGNESIUM HYDROXIDE 400 MG/5ML PO SUSP: 30.0000 mL | Freq: Every day | ORAL | Status: DC | PRN

## 2018-09-08 NOTE — Progress Notes (Addendum)
[address]       [place] [address]             [phone]      3 Days Post-Op Procedure(s) (LRB): MINIMALLY INVASIVE MITRAL VALVE REPAIR (MVR), USING MEMO 4D 30MM AND CLOSURE OF PATENT FORAMEN OVALE (Right) TRANSESOPHAGEAL ECHOCARDIOGRAM (TEE) (N/A) Subjective: Feels pretty well, + constipated  Objective: Vital signs in last 24 hours: Temp:  [97.3 F (36.3 C)-99.1 F (37.3 C)] 97.3 F (36.3 C) (02/22 1617) Pulse Rate:  [63-82] 79 (02/22 1617) Cardiac Rhythm: Heart block (02/22 1500) Resp:  [16-24] 24 (02/22 1617) BP: (111-127)/(65-87) 123/75 (02/22 1617) SpO2:  [96 %-99 %] 98 % (02/22 1617) Weight:  [71.4 kg] 71.4 kg (02/22 0542)  Hemodynamic parameters for last 24 hours:    Intake/Output from previous day: 02/21 0701 - 02/22 0700 In: 350 [P.O.:350] Out: 1 [Urine:1] Intake/Output this shift: Total I/O In: 480 [P.O.:480] Out: -   General appearance: alert, cooperative and no distress Heart: regular rate and rhythm and no murmur or rub Lungs: dim mildly in bases Abdomen: benign Extremities: trace edema Wound: incis healing well  Lab Results: Recent Labs    09/07/18 0407 09/08/18 0322  WBC 15.4* 14.1*  HGB 11.6* 11.8*  HCT 35.2* 34.8*  PLT 130* 128*   BMET:  Recent Labs    09/07/18 0407 09/08/18 0322  NA 134* 136  K 4.2 3.8  CL 104 100  CO2 22 24  GLUCOSE 110* 107*  BUN 15 15  CREATININE 1.43* 1.51*  CALCIUM 8.4* 8.7*    PT/INR:  Recent Labs    09/08/18 0322  LABPROT 14.5  INR 1.14   ABG    Component Value Date/Time   PHART 7.357 09/05/2018 2102   HCO3 17.6 (L) 09/05/2018 2102   TCO2 19 (L) 09/05/2018 2102   ACIDBASEDEF 7.0 (H) 09/05/2018 2102   O2SAT 99.0 09/05/2018 2102   CBG (last 3)  Recent Labs    09/06/18 2327 09/07/18 0425 09/07/18 0741  GLUCAP 92 104* 101*    Meds Scheduled Meds: . acetaminophen  1,000 mg Oral Q6H  . aspirin EC  81 mg Oral Daily  . bisacodyl  10 mg Oral Daily   Or  .  bisacodyl  10 mg Rectal Daily  . Chlorhexidine Gluconate Cloth  6 each Topical Daily  . docusate sodium  200 mg Oral Daily  . enoxaparin (LOVENOX) injection  40 mg Subcutaneous QHS  . furosemide  20 mg Intravenous Once  . mouth rinse  15 mL Mouth Rinse BID  . pantoprazole  40 mg Oral Daily  . pravastatin  40 mg Oral Daily  . sodium chloride flush  10-40 mL Intracatheter Q12H  . sodium chloride flush  3 mL Intravenous Q12H  . warfarin  2.5 mg Oral q1800  . Warfarin - Physician Dosing Inpatient   Does not apply q1800   Continuous Infusions: . sodium chloride Stopped (09/06/18 1737)  . lactated ringers     PRN Meds:.antiseptic oral rinse, metoprolol tartrate, morphine injection, ondansetron (ZOFRAN) IV, oxyCODONE, sodium chloride flush, sodium chloride flush, traMADol  Xrays Dg Chest Port 1 View  Result Date: 09/07/2018 CLINICAL DATA:  Chest tube.  Sore chest. EXAM: PORTABLE CHEST 1 VIEW COMPARISON:  09/06/2018.  09/05/2018. FINDINGS: Right chest tube is been straight, its tip is over the right apex. No definite pneumothorax noted. Heart size stable. No pulmonary venous congestion. Persistent bibasilar atelectasis. No pleural effusion. IMPRESSION: 1. Right  chest tube is been straightening. Its tip is over the right apex. No definite pneumothorax. Left IJ line stable position. 2.  Persistent bibasilar atelectasis. Electronically Signed   By: Marcello Moores  Register   On: 09/07/2018 06:39    Assessment/Plan: S/P Procedure(s) (LRB): MINIMALLY INVASIVE MITRAL VALVE REPAIR (MVR), USING MEMO 4D 30MM AND CLOSURE OF PATENT FORAMEN OVALE (Right) TRANSESOPHAGEAL ECHOCARDIOGRAM (TEE) (N/A)  1 doing well, some retrograde junctional and heart block/brady. BP is stable- not currently off metoprolol- asymptomatic so will hold on pacing for now 2 sats good on RA 3 mild leukocytosis, trend improving. H/H stable 4 creat up a little- volume status looks clinically to be pretty euvolemic- hold on further lasix  for now 5 CT - 160 - cc out yesterday, today not recorded- will probablt d/c tomorrow since getting late in day 6 add lactulose for constipation 7 cont pulm toilet and rehab  LOS: 3 days    John Giovanni [facility] 09/08/2018 Pager [phone]  Agree with above. Chest tube can come out in the morning. INR has not bumped yet on 2.5 mg. Today will be the third dose. Will probably require higher dose of Coumadin if INR not rising in am.

## 2018-09-08 NOTE — Progress Notes (Signed)
Pacing wires not removed d/t rhythm changes per PA.   Sharyn Lull, RN

## 2018-09-08 NOTE — Progress Notes (Signed)
CARDIAC REHAB PHASE I   PRE:  Rate/Rhythm: 77 SR  BP:  Supine:   Sitting: 109/73  Standing:    SaO2: 97% RA  MODE:  Ambulation: 470 ft   POST:  Rate/Rhythm: 76 SR  BP:  Supine:   Sitting: 121/82  Standing:    SaO2: 96% RA  1310-1354 Patient tolerated ambulation well without c/o, vital signs stable. Instructed patient on viewing "Leaving the Hospital After Surgery", IS use, restrictions, heart healthy eating, and activity progression. Pt verbalizes understanding. Discussed phase 2 cardiac rehab, and pt is interested in participating in the program at [facility], referral sent.  Seward Carol

## 2018-09-08 NOTE — Progress Notes (Signed)
Notified PA for recent decrease in heart rate into the 30's x 2.  Pt asymptomatic and states he may have been sleeping on the second drop.   Sharyn Lull, RN

## 2018-09-08 NOTE — Progress Notes (Signed)
This note also relates to the following rows which could not be included: Pulse Rate - Cannot attach notes to unvalidated device data ECG Heart Rate - Cannot attach notes to unvalidated device data Resp - Cannot attach notes to unvalidated device data SpO2 - Cannot attach notes to unvalidated device data

## 2018-09-09 LAB — BASIC METABOLIC PANEL
Anion gap: 11 (ref 5–15)
BUN: 15 mg/dL (ref 8–23)
CALCIUM: 8.7 mg/dL — AB (ref 8.9–10.3)
CO2: 23 mmol/L (ref 22–32)
Chloride: 102 mmol/L (ref 98–111)
Creatinine, Ser: 1.47 mg/dL — ABNORMAL HIGH (ref 0.61–1.24)
GFR calc non Af Amer: 50 mL/min — ABNORMAL LOW (ref >60)
GFR, EST AFRICAN AMERICAN: 58 mL/min — AB (ref >60)
Glucose, Bld: 114 mg/dL — ABNORMAL HIGH (ref 70–99)
Potassium: 3.7 mmol/L (ref 3.5–5.1)
SODIUM: 136 mmol/L (ref 135–145)

## 2018-09-09 LAB — PROTIME-INR
INR: 1
Prothrombin Time: 13.1 seconds (ref 11.4–15.2)

## 2018-09-09 MED ORDER — WARFARIN SODIUM 5 MG PO TABS
5.0000 mg | ORAL_TABLET | Freq: Every day | ORAL | Status: DC
  Administered 2018-09-09 – 2018-09-10 (×2): 5 mg via ORAL
  Filled 2018-09-09 (×2): qty 1

## 2018-09-09 NOTE — Progress Notes (Signed)
Patient ambulated in hallway with walker. Will monitor patient. Deepti Gunawan, Bettina Gavia RN

## 2018-09-09 NOTE — Progress Notes (Addendum)
2311: pt started going in and out of a-flutter, when went to check the patient, he was in bathroom trying to have bowel movement. Once pt came out, 12 lead EKG obtained, that showed a-flutter. Pt non-symptomatic. BP 131/69, denies any sob or chest pain except at surgical site. Breathing even and unlabored in RA, 02 sat 98%. Hr 50-60's. HR dropped to 48 x2 but didn't sustain. Pt stated his rhythm changed this am when he tried to have BM. Lactulose was given with his night time meds. Pt sitiing in recliner comfortably. Denies any needs. Pt still in and out of a flutter, was junctional rhythm before. Charge nurse aware. Will continue to monitor.

## 2018-09-09 NOTE — Progress Notes (Signed)
Both chest tubes removed as ordered. dressing applied. Patient tolerated well will continue to monitor patient. Jem Castro, Bettina Gavia RN

## 2018-09-09 NOTE — Progress Notes (Addendum)
      [address]       [place] [address]             [phone]      4 Days Post-Op Procedure(s) (LRB): MINIMALLY INVASIVE MITRAL VALVE REPAIR (MVR), USING MEMO 4D 30MM AND CLOSURE OF PATENT FORAMEN OVALE (Right) TRANSESOPHAGEAL ECHOCARDIOGRAM (TEE) (N/A) Subjective: He feels like a prisoner and wants to go home. Has not been able to sleep  Objective: Vital signs in last 24 hours: Temp:  [97.3 F (36.3 C)-98.4 F (36.9 C)] 98.2 F (36.8 C) (02/23 0346) Pulse Rate:  [63-79] 63 (02/23 0821) Cardiac Rhythm: Junctional rhythm;Atrial flutter (02/23 0842) Resp:  [16-27] 20 (02/23 0821) BP: (108-133)/(59-75) 133/65 (02/23 0821) SpO2:  [97 %-98 %] 97 % (02/23 0821) Weight:  [71.2 kg] 71.2 kg (02/23 0630)     Intake/Output from previous day: 02/22 0701 - 02/23 0700 In: 880 [P.O.:880] Out: 40 [Chest Tube:40] Intake/Output this shift: Total I/O In: 240 [P.O.:240] Out: 0   General appearance: alert, cooperative and no distress Heart: irregularly irregular rhythm Lungs: clear to auscultation bilaterally Abdomen: soft, non-tender; bowel sounds normal; no masses,  no organomegaly Extremities: extremities normal, atraumatic, no cyanosis or edema Wound: clean and dry  Lab Results: Recent Labs    09/07/18 0407 09/08/18 0322  WBC 15.4* 14.1*  HGB 11.6* 11.8*  HCT 35.2* 34.8*  PLT 130* 128*   BMET:  Recent Labs    09/08/18 0322 09/09/18 0300  NA 136 136  K 3.8 3.7  CL 100 102  CO2 24 23  GLUCOSE 107* 114*  BUN 15 15  CREATININE 1.51* 1.47*  CALCIUM 8.7* 8.7*    PT/INR:  Recent Labs    09/09/18 0300  LABPROT 13.1  INR 1.00   ABG    Component Value Date/Time   PHART 7.357 09/05/2018 2102   HCO3 17.6 (L) 09/05/2018 2102   TCO2 19 (L) 09/05/2018 2102   ACIDBASEDEF 7.0 (H) 09/05/2018 2102   O2SAT 99.0 09/05/2018 2102   CBG (last 3)  Recent Labs    09/06/18 2327 09/07/18 0425 09/07/18 0741  GLUCAP 92 104* 101*     Assessment/Plan: S/P Procedure(s) (LRB): MINIMALLY INVASIVE MITRAL VALVE REPAIR (MVR), USING MEMO 4D 30MM AND CLOSURE OF PATENT FORAMEN OVALE (Right) TRANSESOPHAGEAL ECHOCARDIOGRAM (TEE) (N/A)  1. CV-heart block/brady yesterday and now rate-controlled afib in the 80s. On coumadin but subtherapeutic. Holding BB due to brady. EPW remain in place due to rhythm issues. Increase coumadin to 5mg  daily. INR 1.00 2. Pulm-tolerating room air with excellent saturation. Remove chest tubes 3. Renal-creatinine 1.47-decreasing, electrolytes okay 4. H and H stable 5. Endo-blood glucose well controlled 6. MOM for bowels-had a couple of BMs  Plan: Remove chest tubes. Rhythm remains an issue now in rate-controlled afib. Could start oral Amio but patient prefers to avoid. Would like to avoid BB due to previous bradycardia down to the 30s on previous occassions. Continue to titrate coumadin, also on lovenox.   LOS: 4 days    Elgie Collard 09/09/2018   Chart reviewed, patient examined, agree with above. He feels well.  Rhythm looks either junctional or atrial fib 90. At times it is regular and others not.  Coumadin increased to 5 mg since INR not bumped yet. Chest tubes out. Should be able to go home soon.

## 2018-09-09 NOTE — Progress Notes (Signed)
Pt ambulated 800 ft in hallway without walker. tolerated well, will continue to monitor.

## 2018-09-09 NOTE — Progress Notes (Signed)
Pt ambulated 800 ft last night and this am with front wheel walker, tolerated well. Denied SOB, will continue to monitor.

## 2018-09-10 ENCOUNTER — Inpatient Hospital Stay (HOSPITAL_COMMUNITY): Payer: 59

## 2018-09-10 LAB — PROTIME-INR
INR: 1.08
Prothrombin Time: 13.9 seconds (ref 11.4–15.2)

## 2018-09-10 MED ORDER — WARFARIN SODIUM 5 MG PO TABS: 5.0000 mg | ORAL_TABLET | Freq: Every day | ORAL | 1 refills | Status: DC

## 2018-09-10 MED ORDER — OXYCODONE HCL 5 MG PO TABS: 5.0000 mg | ORAL_TABLET | ORAL | 0 refills | Status: DC | PRN

## 2018-09-10 MED ORDER — ASPIRIN 81 MG PO TBEC: 81.0000 mg | DELAYED_RELEASE_TABLET | Freq: Every day | ORAL | Status: AC

## 2018-09-10 NOTE — Care Management Note (Signed)
Case Management Note Marvetta Gibbons RN, BSN Transitions of Care Unit 4E- RN Case Manager [phone]  Patient Details  Name: John Page MRN: [id number] Date of Birth: 05/25/1956  Subjective/Objective:   Pt admitted s/p mini MVR                 Action/Plan: PTA pt lived at home, plan to return home with family to assist. PCP- Maury Dus.  No CM needs noted for transition home.   Expected Discharge Date:  09/10/18               Expected Discharge Plan:  Home/Self Care  In-House Referral:  NA  Discharge planning Services  CM Consult  Post Acute Care Choice:  NA Choice offered to:  NA  DME Arranged:    DME Agency:     HH Arranged:    HH Agency:     Status of Service:  Completed, signed off  If discussed at [place] of Stay Meetings, dates discussed:     Discharge Disposition: home/self care   Additional Comments:  Dawayne Patricia, RN 09/10/2018, 12:09 PM

## 2018-09-10 NOTE — Progress Notes (Addendum)
      [address]       [place] [address]             [phone]        5 Days Post-Op Procedure(s) (LRB): MINIMALLY INVASIVE MITRAL VALVE REPAIR (MVR), USING MEMO 4D 30MM AND CLOSURE OF PATENT FORAMEN OVALE (Right) TRANSESOPHAGEAL ECHOCARDIOGRAM (TEE) (N/A)  Subjective: Patient sitting in chair without complaints.  Objective: Vital signs in last 24 hours: Temp:  [97.7 F (36.5 C)-98.3 F (36.8 C)] 97.8 F (36.6 C) (02/24 0354) Pulse Rate:  [55-91] 88 (02/24 0354) Cardiac Rhythm: Junctional rhythm;Atrial flutter (02/24 0410) Resp:  [13-36] 36 (02/24 0706) BP: (110-140)/(51-82) 133/81 (02/24 0354) SpO2:  [95 %-99 %] 95 % (02/24 0354) Weight:  [69.8 kg] 69.8 kg (02/24 0706)  Pre op weight 71.2 kg Current Weight  09/10/18 69.8 kg      Intake/Output from previous day: 02/23 0701 - 02/24 0700 In: 530 [P.O.:530] Out: 3 [Urine:2; Stool:1]   Physical Exam:  Cardiovascular: IRRR Pulmonary: Slightly diminished at right base  Abdomen: Soft, non tender, bowel sounds present. Extremities: No lower extremity edema. Wounds: Clean and dry.  No erythema or signs of infection. Chest tube site dressing with sero sanguinous ooze. Dressing was changed by myself  Lab Results: CBC: Recent Labs    09/08/18 0322  WBC 14.1*  HGB 11.8*  HCT 34.8*  PLT 128*   BMET:  Recent Labs    09/08/18 0322 09/09/18 0300  NA 136 136  K 3.8 3.7  CL 100 102  CO2 24 23  GLUCOSE 107* 114*  BUN 15 15  CREATININE 1.51* 1.47*  CALCIUM 8.7* 8.7*    PT/INR:  Lab Results  Component Value Date   INR 1.08 09/10/2018   INR 1.00 09/09/2018   INR 1.14 09/08/2018   ABG:  INR: Will add last result for INR, ABG once components are confirmed Will add last 4 CBG results once components are confirmed  Assessment/Plan:  1. CV - Previous a flutter and junctional. SB into the 40-50's at times.On Coumadin. INR this am 1.08. Continue with 5 mg of Coumadin as will see first  dose result n am. 2.  Pulmonary - On room air. Encourage incentive spirometer.  3. Volume Overload - On Lasix 20 mg IV daily 4.  Acute blood loss anemia - Last H and H 11.8 and 34.8 5. Mild thrombocytopenia-potassium 128,000 6. Will discuss removal of EPW with Dr. Roxy Manns.  Donielle M ZimmermanPA-C 09/10/2018,7:36 AM [phone]   I have seen and examined the patient and agree with the assessment and plan as outlined.  On review of telemetry, I do not see any evidence of Aflutter nor Afib but rather what appears to be NSR w/ competing accelerated junctional rhythm.  In either case patient has remained asymptomatic w/ normal HR and BP.  He is being anticoagulated using warfarin.  Plan d/c home today on Coumadin 5 mg/day plus low dose ASA.  Instructions given.  Rexene Alberts, MD 09/10/2018 8:41 AM

## 2018-09-10 NOTE — Progress Notes (Signed)
CARDIAC REHAB PHASE I    Offered to walk with pt, pt states he has been ambulating independently without difficulty. Reinforced d/c education with pt. Encouraged IS and continued walks. Educated on incision care. Pt referred to CRP II [place] Rufina Falco, RN BSN 09/10/2018 2:52 PM

## 2018-09-10 NOTE — Discharge Instructions (Signed)
Pneumothorax A pneumothorax is commonly called a collapsed lung. It is a condition in which air leaks from a lung and builds up between the thin layer of tissue that covers the lungs (visceral pleura) and the interior wall of the chest cavity (parietal pleura). The air gets trapped outside the lung, between the lung and the chest wall (pleural space). The air takes up space and prevents the lung from fully expanding. This condition sometimes occurs suddenly with no apparent cause. The buildup of air may be small or large. A small pneumothorax may go away on its own. A large pneumothorax will require treatment and hospitalization. What are the causes? This condition may be caused by:  Trauma and injury to the chest wall.  Surgery and other medical procedures.  A complication of an underlying lung problem, especially chronic obstructive pulmonary disease (COPD) or emphysema. Sometimes the cause of this condition is not known. What increases the risk? You are more likely to develop this condition if:  You have an underlying lung problem.  You smoke.  You are 52-10 years old, male, tall, and underweight.  You have a personal or family history of pneumothorax.  You have an eating disorder (anorexia nervosa). This condition can also happen quickly, even in people with no history of lung problems. What are the signs or symptoms? Sometimes a pneumothorax will have no symptoms. When symptoms are present, they can include:  Chest pain.  Shortness of breath.  Increased rate of breathing.  Bluish color to your lips or skin (cyanosis). How is this diagnosed? This condition may be diagnosed by:  A medical history and physical exam.  A chest X-ray, chest CT scan, or ultrasound. How is this treated? Treatment depends on how severe your condition is. The goal of treatment is to remove the extra air and allow your lung to expand back to its normal size.  For a small pneumothorax: ? No  treatment may be needed. ? Extra oxygen is sometimes used to make it go away more quickly.  For a large pneumothorax or a pneumothorax that is causing symptoms, a procedure is done to drain the air from your lungs. To do this, a health care provider may use: ? A needle with a syringe. This is used to suck air from a pleural space where no additional leakage is taking place. ? A chest tube. This is used to suck air where there is ongoing leakage into the pleural space. The chest tube may need to remain in place for several days until the air leak has healed.  In more severe cases, surgery may be needed to repair the damage that is causing the leak.  If you have multiple pneumothorax episodes or have an air leak that will not heal, a procedure called a pleurodesis may be done. A medicine is placed in the pleural space to irritate the tissues around the lung so that the lung will stick to the chest wall, seal any leaks, and stop any buildup of air in that space. If you have an underlying lung problem, severe symptoms, or a large pneumothorax you will usually need to stay in the hospital. Follow these instructions at home: Lifestyle  Do not use any products that contain nicotine or tobacco, such as cigarettes and e-cigarettes. These are major risk factors in pneumothorax. If you need help quitting, ask your health care provider.  Do not lift anything that is heavier than 10 lb (4.5 kg), or the limit that your health  care provider tells you, until he or she says that it is safe.  Avoid activities that take a lot of effort (strenuous) for as long as told by your health care provider.  Return to your normal activities as told by your health care provider. Ask your health care provider what activities are safe for you.  Do not fly in an airplane or scuba dive until your health care provider says it is okay. General instructions  Take over-the-counter and prescription medicines only as told by your  health care provider.  If a cough or pain makes it difficult for you to sleep at night, try sleeping in a semi-upright position in a recliner or by using 2 or 3 pillows.  If you had a chest tube and it was removed, ask your health care provider when you can remove the bandage (dressing). While the dressing is in place, do not allow it to get wet.  Keep all follow-up visits as told by your health care provider. This is important. Contact a health care provider if:  You cough up thick mucus (sputum) that is yellow or green in color.  You were treated with a chest tube, and you have redness, increasing pain, or discharge at the site where it was placed. Get help right away if:  You have increasing chest pain or shortness of breath.  You have a cough that will not go away.  You begin coughing up blood.  You have pain that is getting worse or is not controlled with medicines.  The site where your chest tube was located opens up.  You feel air coming out of the site where the chest tube was placed.  You have a fever or persistent symptoms for more than 2-3 days.  You have a fever and your symptoms suddenly get worse. These symptoms may represent a serious problem that is an emergency. Do not wait to see if the symptoms will go away. Get medical help right away. Call your local emergency services (911 in the [place] Do not drive yourself to the hospital. Summary  A pneumothorax, commonly called a collapsed lung, is a condition in which air leaks from a lung and gets trapped between the lung and the chest wall (pleural space).  The buildup of air may be small or large. A small pneumothorax may go away on its own. A large pneumothorax will require treatment and hospitalization.  Treatment for this condition depends on how severe the pneumothorax is. The goal of treatment is to remove the extra air and allow the lung to expand back to its normal size. This information is not intended to  replace advice given to you by your health care provider. Make sure you discuss any questions you have with your health care provider. Document Released: 07/04/2005 Document Revised: 06/12/2017 Document Reviewed: 06/12/2017 Elsevier Interactive Patient Education  2019 [employer].  Discharge Instructions:  1. You may shower, please wash incisions daily with soap and water and keep dry.  If you wish to cover wounds with dressing you may do so but please keep clean and change daily.  No tub baths or swimming until incisions have completely healed.  If your incisions become red or develop any drainage please call our office at [phone]  2. No Driving until cleared by Dr. Guy Sandifer office and you are no longer using narcotic pain medications  3. Monitor your weight daily.. Please use the same scale and weigh at same time... If you gain 5-10 lbs  in 48 hours with associated lower extremity swelling, please contact our office at [phone]  4. Fever of 101.5 for at least 24 hours with no source, please contact our office at [phone]  5. Activity- up as tolerated, please walk at least 3 times per day.  Avoid strenuous activity, no lifting, pushing, or pulling with your arms over 8-10 lbs for a minimum of 6 weeks  6. If any questions or concerns arise, please do not hesitate to contact our office at [phone]  Prediabetes Prediabetes is the condition of having a blood sugar (blood glucose) level that is higher than it should be, but not high enough for you to be diagnosed with type 2 diabetes. Having prediabetes puts you at risk for developing type 2 diabetes (type 2 diabetes mellitus). Prediabetes may be called impaired glucose tolerance or impaired fasting glucose. Prediabetes usually does not cause symptoms. Your health care provider can diagnose this condition with blood tests. You may be tested for prediabetes if you are overweight and if you have at least one other risk factor for  prediabetes. What is blood glucose, and how is it measured? Blood glucose refers to the amount of glucose in your bloodstream. Glucose comes from eating foods that contain sugars and starches (carbohydrates), which the body breaks down into glucose. Your blood glucose level may be measured in mg/dL (milligrams per deciliter) or mmol/L (millimoles per liter). Your blood glucose may be checked with one or more of the following blood tests:  A fasting blood glucose (FBG) test. You will not be allowed to eat (you will fast) for 8 hours or longer before a blood sample is taken. ? A normal range for FBG is 70-100 mg/dl (3.9-5.6 mmol/L).  An A1c (hemoglobin A1c) blood test. This test provides information about blood glucose control over the previous 2?14months.  An oral glucose tolerance test (OGTT). This test measures your blood glucose at two times: ? After fasting. This is your baseline level. ? Two hours after you drink a beverage that contains glucose. You may be diagnosed with prediabetes:  If your FBG is 100?125 mg/dL (5.6-6.9 mmol/L).  If your A1c level is 5.7?6.4%.  If your OGTT result is 140?199 mg/dL (7.8-11 mmol/L). These blood tests may be repeated to confirm your diagnosis. How can this condition affect me? The pancreas produces a hormone (insulin) that helps to move glucose from the bloodstream into cells. When cells in the body do not respond properly to insulin that the body makes (insulin resistance), excess glucose builds up in the blood instead of going into cells. As a result, high blood glucose (hyperglycemia) can develop, which can cause many complications. Hyperglycemia is a symptom of prediabetes. Having high blood glucose for a long time is dangerous. Too much glucose in your blood can damage your nerves and blood vessels. Long-term damage can lead to complications from diabetes, which may include:  Heart disease.  Stroke.  Blindness.  Kidney  disease.  Depression.  Poor circulation in the feet and legs, which could lead to surgical removal (amputation) in severe cases. What can increase my risk? Risk factors for prediabetes include:  Having a family member with type 2 diabetes.  Being overweight or obese.  Being older than age 58.  Being of American [place], African-American, Hispanic/Latino, or Asian/Pacific Islander descent.  Having an inactive (sedentary) lifestyle.  Having a history of heart disease.  History of gestational diabetes or polycystic ovary syndrome (PCOS), in women.  Having low levels of good cholesterol (  HDL-C) or high levels of blood fats (triglycerides).  Having high blood pressure. What actions can I take to prevent diabetes?      Be physically active. ? Do moderate-intensity physical activity for 30 or more minutes on 5 or more days of the week, or as much as told by your health care provider. This could be brisk walking, biking, or water aerobics. ? Ask your health care provider what activities are safe for you. A mix of physical activities may be best, such as walking, swimming, cycling, and strength training.  Lose weight as told by your health care provider. ? Losing 5-7% of your body weight can reverse insulin resistance. ? Your health care provider can determine how much weight loss is best for you and can help you lose weight safely.  Follow a healthy meal plan. This includes eating lean proteins, complex carbohydrates, fresh fruits and vegetables, low-fat dairy products, and healthy fats. ? Follow instructions from your health care provider about eating or drinking restrictions. ? Make an appointment to see a diet and nutrition specialist (registered dietitian) to help you create a healthy eating plan that is right for you.  Do not smoke or use any tobacco products, such as cigarettes, chewing tobacco, and e-cigarettes. If you need help quitting, ask your health care provider.  Take  over-the-counter and prescription medicines as told by your health care provider. You may be prescribed medicines that help lower the risk of type 2 diabetes.  Keep all follow-up visits as told by your health care provider. This is important. Summary  Prediabetes is the condition of having a blood sugar (blood glucose) level that is higher than it should be, but not high enough for you to be diagnosed with type 2 diabetes.  Having prediabetes puts you at risk for developing type 2 diabetes (type 2 diabetes mellitus).  To help prevent type 2 diabetes, make lifestyle changes such as being physically active and eating a healthy diet. Lose weight as told by your health care provider. This information is not intended to replace advice given to you by your health care provider. Make sure you discuss any questions you have with your health care provider. Document Released: 10/26/2015 Document Revised: 02/21/2017 Document Reviewed: 08/25/2015 Elsevier Interactive Patient Education  2019 [place] on my medicine - Coumadin   (Warfarin)  Why was Coumadin prescribed for you? Coumadin was prescribed for you because you have a blood clot or a medical condition that can cause an increased risk of forming blood clots. Blood clots can cause serious health problems by blocking the flow of blood to the heart, lung, or brain. Coumadin can prevent harmful blood clots from forming. As a reminder your indication for Coumadin is:   Blood Clot Prevention After Heart Valve Surgery  What test will check on my response to Coumadin? While on Coumadin (warfarin) you will need to have an INR test regularly to ensure that your dose is keeping you in the desired range. The INR (international normalized ratio) number is calculated from the result of the laboratory test called prothrombin time (PT).  If an INR APPOINTMENT HAS NOT ALREADY BEEN MADE FOR YOU please schedule an appointment to have this lab  work done by your health care provider within 7 days. Your INR goal is usually a number between:  2 to 3 or your provider may give you a more narrow range like 2-2.5.  Ask your health care provider during an office visit what  your goal INR is.  What  do you need to  know  About  COUMADIN? Take Coumadin (warfarin) exactly as prescribed by your healthcare provider about the same time each day.  DO NOT stop taking without talking to the doctor who prescribed the medication.  Stopping without other blood clot prevention medication to take the place of Coumadin may increase your risk of developing a new clot or stroke.  Get refills before you run out.  What do you do if you miss a dose? If you miss a dose, take it as soon as you remember on the same day then continue your regularly scheduled regimen the next day.  Do not take two doses of Coumadin at the same time.  Important Safety Information A possible side effect of Coumadin (Warfarin) is an increased risk of bleeding. You should call your healthcare provider right away if you experience any of the following: ? Bleeding from an injury or your nose that does not stop. ? Unusual colored urine (red or dark brown) or unusual colored stools (red or black). ? Unusual bruising for unknown reasons. ? A serious fall or if you hit your head (even if there is no bleeding).  Some foods or medicines interact with Coumadin (warfarin) and might alter your response to warfarin. To help avoid this: ? Eat a balanced diet, maintaining a consistent amount of Vitamin K. ? Notify your provider about major diet changes you plan to make. ? Avoid alcohol or limit your intake to 1 drink for women and 2 drinks for men per day. (1 drink is 5 oz. wine, 12 oz. beer, or 1.5 oz. liquor.)  Make sure that ANY health care provider who prescribes medication for you knows that you are taking Coumadin (warfarin).  Also make sure the healthcare provider who is monitoring your  Coumadin knows when you have started a new medication including herbals and non-prescription products.  Coumadin (Warfarin)  Major Drug Interactions  Increased Warfarin Effect Decreased Warfarin Effect  Alcohol (large quantities) Antibiotics (esp. Septra/Bactrim, Flagyl, Cipro) Amiodarone (Cordarone) Aspirin (ASA) Cimetidine (Tagamet) Megestrol (Megace) NSAIDs (ibuprofen, naproxen, etc.) Piroxicam (Feldene) Propafenone (Rythmol SR) Propranolol (Inderal) Isoniazid (INH) Posaconazole (Noxafil) Barbiturates (Phenobarbital) Carbamazepine (Tegretol) Chlordiazepoxide (Librium) Cholestyramine (Questran) Griseofulvin Oral Contraceptives Rifampin Sucralfate (Carafate) Vitamin K   Coumadin (Warfarin) Major Herbal Interactions  Increased Warfarin Effect Decreased Warfarin Effect  Garlic Ginseng Ginkgo biloba Coenzyme Q10 Green tea [place]. Johns wort    Coumadin (Warfarin) FOOD Interactions  Eat a consistent number of servings per week of foods HIGH in Vitamin K (1 serving =  cup)  Collards (cooked, or boiled & drained) Kale (cooked, or boiled & drained) Mustard greens (cooked, or boiled & drained) Parsley *serving size only =  cup Spinach (cooked, or boiled & drained) Swiss chard (cooked, or boiled & drained) Turnip greens (cooked, or boiled & drained)  Eat a consistent number of servings per week of foods MEDIUM-HIGH in Vitamin K (1 serving = 1 cup)  Asparagus (cooked, or boiled & drained) Broccoli (cooked, boiled & drained, or raw & chopped) Brussel sprouts (cooked, or boiled & drained) *serving size only =  cup Lettuce, raw (green leaf, endive, romaine) Spinach, raw Turnip greens, raw & chopped   These websites have more information on Coumadin (warfarin):  [employer]Factory.se; VeganReport.com.au;

## 2018-09-10 NOTE — Progress Notes (Signed)
Pacing wires removed at 10:35 am without difficulty.  BP Q15 mins x 1 hour and bedrest initiated x 1 hour.  Sharyn Lull, RN

## 2018-09-11 ENCOUNTER — Telehealth (HOSPITAL_COMMUNITY): Payer: Self-pay

## 2018-09-11 MED FILL — Magnesium Sulfate Inj 50%: INTRAMUSCULAR | Qty: 10 | Status: AC

## 2018-09-11 MED FILL — Sodium Bicarbonate IV Soln 8.4%: INTRAVENOUS | Qty: 50 | Status: AC

## 2018-09-11 MED FILL — Lidocaine HCl(Cardiac) IV PF Soln Pref Syr 100 MG/5ML (2%): INTRAVENOUS | Qty: 25 | Status: AC

## 2018-09-11 MED FILL — Sodium Chloride IV Soln 0.9%: INTRAVENOUS | Qty: 2000 | Status: AC

## 2018-09-11 MED FILL — Heparin Sodium (Porcine) Inj 1000 Unit/ML: INTRAMUSCULAR | Qty: 30 | Status: AC

## 2018-09-11 MED FILL — Mannitol IV Soln 20%: INTRAVENOUS | Qty: 1000 | Status: AC

## 2018-09-11 MED FILL — Potassium Chloride Inj 2 mEq/ML: INTRAVENOUS | Qty: 40 | Status: AC

## 2018-09-11 MED FILL — Electrolyte-R (PH 7.4) Solution: INTRAVENOUS | Qty: 4000 | Status: AC

## 2018-09-11 NOTE — Discharge Summary (Signed)
[address]       ,[place] [address]             [phone]               Patient ID: John Page MRN: [id number] DOB/AGE: [birth year] 63 y.o.  Admit date: 09/05/2018 Discharge date: 09/10/2018  Admission Diagnoses: 1. Severe mitral regurgitation 2. Patent foramen ovale  Discharge Diagnoses:  1. S/P minimally invasive mitral valve repair 2. S/P patent foramen ovale closure 3. History of melanoma ([place]) 4. History of hyperlipidemia   Procedure (s):   Minimally-Invasive Mitral Valve Repair Complex valvuloplasty including triangular resection of flail segment of posterior leaflet Artificial Gore-tex neochord placement x6 Sorin Memo 4D Ring Annuloplasty (size 1mm, catalog #[id number], serial #[id number]) Closure of patent foramen ovale by Dr. Roxy Manns on 09/05/2018  History of Presenting Illness: Patient has been physically active and healthy all of his adult life. Approximately 5 years ago he was noted to have a heart murmur on physical exam. Echocardiogram revealed mitral valve prolapse with moderate mitral regurgitation. He has been followed regularly ever since by Dr. Irish Lack.Recent follow-up echocardiogram revealed mitral valve prolapse with severe mitral regurgitation. Left ventricular function remain normal with ejection fraction estimated 60 to 65%. The patient subsequently underwent TEE and diagnostic cardiac catheterization on August 24, 2018. TEE confirmed the presence of mitral valve prolapse with a large flail segment involving a portion of the middle scallop of the posterior leaflet. There were ruptured primary chordae tendinaeand severe mitral regurgitation. No other significant abnormalities were noted. Diagnostic cardiac catheterization was notable for normal coronary artery anatomy with no significant coronary artery disease. Right heart pressures were normal. Angiography of the  abdominal aorta and iliac vessels revealed no significant aortoiliac disease. Cardiothoracic surgical consultation was requested.  Patient is single and lives alone locally in [place]. He is retired from the [employer]. He currently works part-time at [employer]. He walks on a regular basis although he admits that for the past year or so he has not been doing any other sort of exercise. He used to exercise more regularly until he underwent inguinal hernia repair. He has noticed that over the past year he has developed some symptoms of exertional shortness of breath. Symptoms occur with moderate level activity. He also gets short of breath if he lays flat in bed. He denies any history of chest pain or chest tightness with exertion. He has not had palpitations, dizzy spells, nor syncope. He denies any history of PND or lower extremity edema. The patient was counseled at length regarding the indications, risks and potential benefits of mitral valve repair. The rationale for elective surgery has been explained, including a comparison between surgery and continued medical therapy with close follow-up. The likelihood of successful and durable valve repair has been discussed with particular reference to the findings of their recent echocardiogram. Based upon these findings and previous experience, I have quoted them a greater than 95percent likelihood of successful valve repair. Alternative surgical approaches have been discussed including a comparison between conventional sternotomy and minimally-invasive techniques. The relative risks and benefits of each have been reviewed as they pertain to the patient's specific circumstances, and all of their questions have been addressed.Expectations for his postoperative convalescence have been discussed.The patient understands and accepts all potential risks of surgery. He was admitted on 02/19 in order to undergo  minimally invasive mitral valve repair.  Brief Hospital Course:  The patient was extubated  in the OR after surgery without difficulty. He remained afebrile and hemodynamically stable. Gordy Councilman, a line, and foley were removed early in the post operative course. Chest tubes remained for a few days after output decreased, were removed on 02/23. Low dose Lopressor was started . He was started on Coumadin and daily PT/INR were monitored. He was volume over loaded and diuresed. He had ABL anemia. He did not require a post op transfusion. Last H and H was 11.8 and 34.8. He was weaned off the insulin drip. The patient's HGA1C pre op was  5.8. He likely has pre diabetes. Nutrition recommendations were given to him and he will need further surveillance with his medical doctor after discharge.The patient was felt surgically stable for transfer from the ICU to PCTU for further convalescence on 02/21. He continues to progress with cardiac rehab. He was ambulating on room air. He has been tolerating a diet and has had a bowel movement. Regarding his rhythm, it appears to be sinus rhythm with competing accelerated junctional. He has been asymptomatic even when mildly bradycardic. He has also NOT been on a beta blocker. Epicardial pacing wires were removed on 02/24 . As of the day of discharge, his INR is 1.08  and he will be discharged on 5 mg of Coumadin. He has a PT/INR appointment for Wednesday 02/26. Chest tube sutures will be removed in the office after discharge. As discussed with Dr. Roxy Manns, the patient is felt surgically stable for discharge today.   Latest Vital Signs: Blood pressure 129/81, pulse 77, temperature 97.9 F (36.6 C), temperature source Oral, resp. rate (!) 25, height 5\' 10"  (1.778 m), weight 69.8 kg, SpO2 97 %.  Physical Exam: Cardiovascular:RRR Pulmonary:Slightly diminished at right base Abdomen: Soft, non tender, bowel sounds present. Extremities:Nolower extremity edema. Wounds:  Clean and dry. No erythema or signs of infection. Chest tube site dressing with minor sero sanguinous ooze. Dressing was changed by myself  Discharge Condition: Stable and discharged to home.  Recent laboratory studies:  RecentLabs  Lab Results  Component Value Date   WBC 14.1 (H) 09/08/2018   HGB 11.8 (L) 09/08/2018   HCT 34.8 (L) 09/08/2018   MCV 96.4 09/08/2018   PLT 128 (L) 09/08/2018     RecentLabs       Lab Results  Component Value Date   NA 136 09/09/2018   K 3.7 09/09/2018   CL 102 09/09/2018   CO2 23 09/09/2018   CREATININE 1.47 (H) 09/09/2018   GLUCOSE 114 (H) 09/09/2018      Diagnostic Studies:   EXAM: CHEST - 2 VIEW  COMPARISON: 09/07/2018  FINDINGS: Cardiac shadow is stable. Postsurgical changes are again seen. Left jugular central line is been removed in the interval. Right-sided chest tubes have been removed. No significant recurrent pneumothorax is noted. Minimal right basilar atelectasis is again noted. No bony abnormality is seen.  IMPRESSION: No significant recurrent pneumothorax following chest tube removal on the right.   Electronically Signed By: Inez Catalina M.D. On: 09/10/2018 12:55   Vas US Doppler Pre Cabg  Result Date: 09/04/2018 PREOPERATIVE VASCULAR EVALUATION  Indications: Pre op mvr. Performing Technologist: Birdena Crandall, [place] [facility]  Examination Guidelines: A complete evaluation includes B-mode imaging, spectral Doppler, color Doppler, and power Doppler as needed of all accessible portions of each vessel. Bilateral testing is considered an integral part of a complete examination. Limited examinations for reoccurring indications may be performed as noted.  Right Carotid Findings: +----------+--------+--------+--------+--------+--------------------+  PSV cm/sEDV cm/sStenosisDescribeComments             +----------+--------+--------+--------+--------+--------------------+ CCA Prox  83      16                       mild intimal changes +----------+--------+--------+--------+--------+--------------------+ CCA Distal71      21                      mild intimal changes +----------+--------+--------+--------+--------+--------------------+ ICA Prox  41      8                       mild intimal changes +----------+--------+--------+--------+--------+--------------------+ ICA Mid   87      20                                           +----------+--------+--------+--------+--------+--------------------+ ICA Distal61      25                                           +----------+--------+--------+--------+--------+--------------------+ ECA       100     17                      mild intimal changes +----------+--------+--------+--------+--------+--------------------+ Portions of this table do not appear on this page. +----------+--------+-------+--------+------------+           PSV cm/sEDV cmsDescribeArm Pressure +----------+--------+-------+--------+------------+ Subclavian83                     143          +----------+--------+-------+--------+------------+ +---------+--------+--+--------+-+ VertebralPSV cm/s24EDV cm/s6 +---------+--------+--+--------+-+ Left Carotid Findings: +----------+-------+--------+--------+---------------------+-------------------+           PSV    EDV cm/sStenosisDescribe             Comments                      cm/s                                                            +----------+-------+--------+--------+---------------------+-------------------+ CCA Prox  88     13                                   mild intimal                                                              changes             +----------+-------+--------+--------+---------------------+-------------------+ CCA Distal85     21  mild intimal                                                               changes             +----------+-------+--------+--------+---------------------+-------------------+ ICA Prox  54     15              focal and            minimal plaque                                       heterogenous                             +----------+-------+--------+--------+---------------------+-------------------+ ICA Mid   68     21                                                       +----------+-------+--------+--------+---------------------+-------------------+ ICA Distal70     24                                                       +----------+-------+--------+--------+---------------------+-------------------+ ECA       99     16                                   mild intimal                                                              changes             +----------+-------+--------+--------+---------------------+-------------------+ +----------+--------+--------+--------+------------+ SubclavianPSV cm/sEDV cm/sDescribeArm Pressure +----------+--------+--------+--------+------------+           91                      146          +----------+--------+--------+--------+------------+ +---------+--------+--+--------+--+ VertebralPSV cm/s40EDV cm/s13 +---------+--------+--+--------+--+  ABI Findings: +--------+------------------+-----+---------+--------+ Right   Rt Pressure (mmHg)IndexWaveform Comment  +--------+------------------+-----+---------+--------+ [id number]                    triphasic         +--------+------------------+-----+---------+--------+ +--------+------------------+-----+---------+-------+ Left    Lt Pressure (mmHg)IndexWaveform Comment +--------+------------------+-----+---------+-------+ [id number]                    triphasic        +--------+------------------+-----+---------+-------+  Right Doppler Findings: +-----------+--------+-----+---------+--------+ Site        PressureIndexDoppler  Comments +-----------+--------+-----+---------+--------+ Brachial   143  triphasic         +-----------+--------+-----+---------+--------+ Radial                  triphasic         +-----------+--------+-----+---------+--------+ Ulnar                   triphasic         +-----------+--------+-----+---------+--------+ Palmar Arch                      normal   +-----------+--------+-----+---------+--------+  Left Doppler Findings: +-----------+--------+-----+---------+--------+ Site       PressureIndexDoppler  Comments +-----------+--------+-----+---------+--------+ Brachial   146          triphasic         +-----------+--------+-----+---------+--------+ Radial                  triphasic         +-----------+--------+-----+---------+--------+ Ulnar                   triphasic         +-----------+--------+-----+---------+--------+ Palmar Arch                      normal   +-----------+--------+-----+---------+--------+  Summary: Right Carotid: There is no evidence of stenosis in the right ICA. Left Carotid: Velocities in the left ICA are consistent with a 1-39% stenosis. Vertebrals:  Bilateral vertebral arteries demonstrate antegrade flow. Subclavians: Normal flow hemodynamics were seen in bilateral subclavian              arteries. Right Upper Extremity: Doppler waveforms remain within normal limits with right radial compression. Doppler waveforms remain within normal limits with right ulnar compression. Left Upper Extremity: Doppler waveforms remain within normal limits with left radial compression. Doppler waveforms remain within normal limits with left ulnar compression.  Electronically signed by Deitra Mayo MD on 09/04/2018 at 2:48:47 PM.    Final         Discharge Instructions    Amb Referral to Cardiac Rehabilitation   Complete by:  As directed    Diagnosis:  Valve Repair   Valve:  Mitral       Discharge Medications: Allergies as of 09/10/2018   No Known Allergies        Medication List    TAKE these medications   acetaminophen 500 MG tablet Commonly known as:  TYLENOL Take 1,000 mg by mouth every 6 (six) hours as needed for moderate pain or headache.   amoxicillin 500 MG capsule Commonly known as:  AMOXIL 4 CAPSULES 1 HOUR PRIOR TO DENTAL What changed:  See the new instructions.   aspirin 81 MG EC tablet Take 1 tablet (81 mg total) by mouth daily. Start taking on:  September 11, 2018   oxyCODONE 5 MG immediate release tablet Commonly known as:  Oxy IR/ROXICODONE Take 1 tablet (5 mg total) by mouth every 4 (four) hours as needed for severe pain.   pravastatin 40 MG tablet Commonly known as:  PRAVACHOL Take 40 mg by mouth daily.   warfarin 5 MG tablet Commonly known as:  COUMADIN Take 1 tablet (5 mg total) by mouth daily at 6 PM. Or as directed      The patient has been discharged on:   1.Beta Blocker:  Yes [  ]  No   [  x ]                              If No, reason:Junctional, bradycardia  2.Ace Inhibitor/ARB: Yes [   ]                                     No  [  x  ]                                     If No, reason:Labile BP  3.Statin:   Yes [ x  ]                  No  [   ]                  If No, reason:  4.Ecasa:  Yes  [ x  ]                  No   [   ]                  If No, reason:  Follow Up Appointments:    Follow-up Information    Rexene Alberts, MD. Go on 10/01/2018.   Specialty:  Cardiothoracic Surgery Why:  PA/LAT CXR (to be taken at [facility] which is in the same building as Dr. Guy Sandifer office) on 03/16 at 12:30 pm;Appointment time is at 1:00 pm Contact information: [address] [place] [address] [place] Office. Go on 09/12/2018.   Specialty:  Cardiology Why:  Appointment is for PT/INR (on Coumadin for  MV repair) to be drawn. Appointment time is at 2:30 pm Contact information: [address] [place]       Lyda Jester M, [place]. Go on 09/25/2018.   Specialties:  Cardiology, Radiology Why:  Appointment time is at 11:00 am Contact information: [place] [address] [phone]        Nurse. Go on 09/21/2018.   Why:  Appointment is with nurse only to have chest sutures removed. Appointment time is at 10:00 am Contact information: [address]  [place] [address]       Maury Dus, MD. Call.   Specialty:  Family Medicine Why:  for a follow up appointment regarding further surveillance of HGA1C 5.8 (pre diabetes) Contact information: [place] [address] [phone]           Signed: Sharalyn Ink [facility] 09/10/2018, 1:15 PM

## 2018-09-11 NOTE — Telephone Encounter (Signed)
Called patient to see if he is interested in the Cardiac Rehab Program. Patient stated that he does his on exercise and that he will pass at this time.  Close referral

## 2018-09-12 ENCOUNTER — Ambulatory Visit (INDEPENDENT_AMBULATORY_CARE_PROVIDER_SITE_OTHER): Payer: 59 | Admitting: Pharmacist

## 2018-09-12 DIAGNOSIS — I059 Rheumatic mitral valve disease, unspecified: Secondary | ICD-10-CM

## 2018-09-12 DIAGNOSIS — Z8774 Personal history of (corrected) congenital malformations of heart and circulatory system: Secondary | ICD-10-CM | POA: Diagnosis not present

## 2018-09-12 DIAGNOSIS — Z5181 Encounter for therapeutic drug level monitoring: Secondary | ICD-10-CM | POA: Diagnosis not present

## 2018-09-12 DIAGNOSIS — Z9889 Other specified postprocedural states: Secondary | ICD-10-CM

## 2018-09-12 LAB — POCT INR: INR: 1.4 — AB (ref 2.0–3.0)

## 2018-09-12 NOTE — Patient Instructions (Signed)
Description   Today take 1.5 tablets then start taking 1 tablet (5mg ) daily except 0.5 tablets (2.5 mg) on Mondays. [facility] [phone] Main [phone]

## 2018-09-19 ENCOUNTER — Ambulatory Visit (INDEPENDENT_AMBULATORY_CARE_PROVIDER_SITE_OTHER): Payer: 59 | Admitting: Pharmacist

## 2018-09-19 DIAGNOSIS — I059 Rheumatic mitral valve disease, unspecified: Secondary | ICD-10-CM

## 2018-09-19 DIAGNOSIS — Z9889 Other specified postprocedural states: Secondary | ICD-10-CM

## 2018-09-19 DIAGNOSIS — Z5181 Encounter for therapeutic drug level monitoring: Secondary | ICD-10-CM | POA: Diagnosis not present

## 2018-09-19 DIAGNOSIS — Z8774 Personal history of (corrected) congenital malformations of heart and circulatory system: Secondary | ICD-10-CM

## 2018-09-19 LAB — POCT INR: INR: 1.3 — AB (ref 2.0–3.0)

## 2018-09-19 NOTE — Patient Instructions (Signed)
Description   Today take 1.5 tablets then start taking 1 tablet (5mg ) daily except 1.5 tablets (7.5 mg) on Mondays and Fridays. Recheck INR in 1 week. [facility] [phone] Main [phone]

## 2018-09-21 ENCOUNTER — Ambulatory Visit (INDEPENDENT_AMBULATORY_CARE_PROVIDER_SITE_OTHER): Payer: Self-pay

## 2018-09-21 ENCOUNTER — Other Ambulatory Visit: Payer: Self-pay

## 2018-09-21 DIAGNOSIS — Z4802 Encounter for removal of sutures: Secondary | ICD-10-CM

## 2018-09-21 DIAGNOSIS — Z736 Limitation of activities due to disability: Secondary | ICD-10-CM

## 2018-09-21 NOTE — Progress Notes (Signed)
Patient arrived for nurse visit to remove 2 sutures post- procedure Mini MVR, PFO closure with Dr. Roxy Manns 09/05/2018.  Sutures removed with no signs/ symptoms of infection noted.  Patient tolerated procedure well.  Patient instructed to keep the incision sites clean and dry.  Advised to notify our office if he started to experience any signs/ symptoms of infection.  Patient acknowledged instructions given.  He is aware of his follow-up appointment.

## 2018-09-25 ENCOUNTER — Encounter: Payer: Self-pay | Admitting: Cardiology

## 2018-09-25 ENCOUNTER — Ambulatory Visit (INDEPENDENT_AMBULATORY_CARE_PROVIDER_SITE_OTHER): Payer: 59 | Admitting: Pharmacist

## 2018-09-25 ENCOUNTER — Ambulatory Visit: Payer: 59 | Admitting: Cardiology

## 2018-09-25 VITALS — BP 132/80 | HR 84 | Ht 70.0 in | Wt 153.4 lb

## 2018-09-25 DIAGNOSIS — Z5181 Encounter for therapeutic drug level monitoring: Secondary | ICD-10-CM

## 2018-09-25 DIAGNOSIS — Z9889 Other specified postprocedural states: Secondary | ICD-10-CM

## 2018-09-25 DIAGNOSIS — Z8774 Personal history of (corrected) congenital malformations of heart and circulatory system: Secondary | ICD-10-CM | POA: Diagnosis not present

## 2018-09-25 DIAGNOSIS — I059 Rheumatic mitral valve disease, unspecified: Secondary | ICD-10-CM

## 2018-09-25 DIAGNOSIS — E785 Hyperlipidemia, unspecified: Secondary | ICD-10-CM | POA: Diagnosis not present

## 2018-09-25 LAB — POCT INR: INR: 1.6 — AB (ref 2.0–3.0)

## 2018-09-25 NOTE — Patient Instructions (Signed)
Description   Today take 1.5 tablets then start taking 1.5 tablet (7.5mg ) daily except 1 tablets (5 mg) on Sundays, Tuesdays, and Thursdays. Recheck INR in 1 week. [facility] [phone] Main [phone]

## 2018-09-25 NOTE — Patient Instructions (Signed)
Medication Instructions:  NONE If you need a refill on your cardiac medications before your next appointment, please call your pharmacy.   Lab work:10/02/18 Fasting Lipid Profile If you have labs (blood work) drawn today and your tests are completely normal, you will receive your results only by: Marland Kitchen MyChart Message (if you have MyChart) OR . A paper copy in the mail If you have any lab test that is abnormal or we need to change your treatment, we will call you to review the results.  Testing/Procedures: none  Follow-Up: At [facility], you and your health needs are our priority.  As part of our continuing mission to provide you with exceptional heart care, we have created designated Provider Care Teams.  These Care Teams include your primary Cardiologist (physician) and Advanced Practice Providers (APPs -  Physician Assistants and Nurse Practitioners) who all work together to provide you with the care you need, when you need it. You will need a follow up appointment in 1 years.  Please call our office 2 months in advance to schedule this appointment.  You may see Larae Grooms, MD or one of the following Advanced Practice Providers on your designated Care Team:   Garey, PA-C Melina Copa, PA-C . Ermalinda Barrios, PA-C  Any Other Special Instructions Will Be Listed Below (If Applicable).

## 2018-09-25 NOTE — Progress Notes (Signed)
09/25/2018 John Page   1956-05-07  [id number]  Primary Physician Maury Dus, MD Primary Cardiologist: Larae Grooms, MD  Electrophysiologist: None   Reason for Visit/CC: Post hospital follow-up status post minimally invasive mitral valve repair and PFO closure  HPI:  John Page is a 63 y.o. male who is being seen today for post hospital follow-up.  Approximately 5 years ago he was discovered to have a cardiac murmur and was referred to Dr. Irish Lack.  Echocardiogram showed mitral regurgitation however not severe at that time of diagnosis.  He has since been followed yearly.  Most recently,  he reported increased fatigue and mild dyspnea.  Repeat echo showed severe mitral regurgitation w /flail posterior MV. This was confirmed by TEE and he was referred for cardiac catheterization for assessment of his coronaries.  Cardiac catheterization revealed no CAD.  He was referred to Dr. Roxy Manns who recommended minimally invasive mitral valve repair.  Surgery was done September 05, 2018.  He also underwent closure of a small PFO.  Postoperative course was fairly unremarkable.  He presents to clinic today for follow-up.  He reports that he has done well.  No postoperative complications.  His chest incisions are well-healed.  He denies any chest pain or dyspnea.  Reports improved exercise tolerance.  He is on Coumadin which he will need to continue for 3 months.  He is followed in our Coumadin clinic.  He was seen today.  His INR was slightly subtherapeutic at 1.6.  He has been given dosing instructions.  He follows up with Dr. Roxy Manns next Monday and again with Coumadin clinic next Tuesday.  He also carries a diagnosis of hyperlipidemia.  Last lipid panel February 2019 showed elevated LDL at 182 mg/dL.  His PCP has started him on pravastatin.  He has been taking this religiously for the past 3 months however he has not had a repeat lipid panel.  He did have comprehensive metabolic panel during his  recent hospitalization in February and ALT was within normal limits.  Current Meds  Medication Sig  . acetaminophen (TYLENOL) 500 MG tablet Take 1,000 mg by mouth every 6 (six) hours as needed for moderate pain or headache.   Marland Kitchen amoxicillin (AMOXIL) 500 MG capsule Take 4 caps by mouth 1 hour prior to dental appt  . aspirin EC 81 MG EC tablet Take 1 tablet (81 mg total) by mouth daily.  . pravastatin (PRAVACHOL) 40 MG tablet Take 40 mg by mouth daily.  Marland Kitchen warfarin (COUMADIN) 5 MG tablet Take 1 tablet (5 mg total) by mouth daily at 6 PM. Or as directed   No Known Allergies Past Medical History:  Diagnosis Date  . Cancer ([place])    melanoma  . Heart murmur   . Mitral valve disorders(424.0)    severe MVP by 07/25/17 echo  . Other and unspecified hyperlipidemia   . 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 S/P patent foramen ovale closure 09/05/2018   Family History  Problem Relation Age of Onset  . Hypertension Mother   . Heart attack Mother   . Heart disease Mother        CABG  . Lung cancer Father   . Hypertension Brother    Past Surgical History:  Procedure Laterality Date  . ABDOMINAL AORTOGRAM N/A 08/24/2018   Procedure: ABDOMINAL  AORTOGRAM;  Surgeon: Jettie Booze, MD;  Location: [facility];  Service: Cardiovascular;  Laterality: N/A;  . CARDIAC CATHETERIZATION    . HERNIA REPAIR    . INGUINAL HERNIA REPAIR Left 10/09/2017   Procedure: LEFT INGUINAL HERNIA REPAIR;  Surgeon: Georganna Skeans, MD;  Location: [facility];  Service: General;  Laterality: Left;  . INSERTION OF MESH Left 10/09/2017   Procedure: INSERTION OF MESH;  Surgeon: Georganna Skeans, MD;  Location: [facility];  Service: General;  Laterality: Left;  Marland Kitchen MELANOMA EXCISION    . MITRAL VALVE REPAIR Right 09/05/2018    Procedure: MINIMALLY INVASIVE MITRAL VALVE REPAIR (MVR), USING MEMO 4D 30MM AND CLOSURE OF PATENT FORAMEN OVALE;  Surgeon: Rexene Alberts, MD;  Location: [facility];  Service: Open Heart Surgery;  Laterality: Right;  . RIGHT/LEFT HEART CATH AND CORONARY ANGIOGRAPHY N/A 08/24/2018   Procedure: RIGHT/LEFT HEART CATH AND CORONARY ANGIOGRAPHY;  Surgeon: Jettie Booze, MD;  Location: [facility];  Service: Cardiovascular;  Laterality: N/A;  . TEE WITHOUT CARDIOVERSION N/A 08/24/2018   Procedure: TRANSESOPHAGEAL ECHOCARDIOGRAM (TEE);  Surgeon: Fay Records, MD;  Location: [facility];  Service: Cardiovascular;  Laterality: N/A;  . TEE WITHOUT CARDIOVERSION N/A 09/05/2018   Procedure: TRANSESOPHAGEAL ECHOCARDIOGRAM (TEE);  Surgeon: Rexene Alberts, MD;  Location: [facility];  Service: Open Heart Surgery;  Laterality: N/A;  . TONSILLECTOMY     Social History   Socioeconomic History  . Marital status: Single    Spouse name: Not on file  . Number of children: Not on file  . Years of education: Not on file  . Highest education level: Not on file  Occupational History  . Not on file  Social Needs  . Financial resource strain: Not on file  . Food insecurity:    Worry: Not on file    Inability: Not on file  . Transportation needs:    Medical: Not on file    Non-medical: Not on file  Tobacco Use  . Smoking status: Former Smoker    Last attempt to quit: 07/05/1984    Years since quitting: 34.2  . Smokeless tobacco: Never Used  Substance and Sexual Activity  . Alcohol use: Not on file    Comment: occasional  . Drug use: No  . Sexual activity: Not on file  Lifestyle  . Physical activity:    Days per week: Not on file    Minutes per session: Not on file  . Stress: Not on file  Relationships  . Social connections:    Talks on phone: Not on file    Gets together: Not on file    Attends religious service: Not on file    Active member of club or organization: Not on file    Attends  meetings of clubs or organizations: Not on file    Relationship status: Not on file  . Intimate partner violence:    Fear of current or ex partner: Not on file    Emotionally abused: Not on file    Physically abused: Not on file    Forced sexual activity: Not on file  Other Topics Concern  . Not on file  Social History Narrative  . Not on file     Lipid Panel  No results found for: CHOL, TRIG, HDL, CHOLHDL, VLDL, LDLCALC, LDLDIRECT  Review of Systems: General: negative for chills, fever, night sweats or weight changes.  Cardiovascular: negative for chest pain, dyspnea on exertion, edema, orthopnea, palpitations, paroxysmal  nocturnal dyspnea or shortness of breath Dermatological: negative for rash Respiratory: negative for cough or wheezing Urologic: negative for hematuria Abdominal: negative for nausea, vomiting, diarrhea, bright red blood per rectum, melena, or hematemesis Neurologic: negative for visual changes, syncope, or dizziness All other systems reviewed and are otherwise negative except as noted above.   Physical Exam:  Blood pressure 132/80, pulse 84, height 5\' 10"  (1.778 m), weight 153 lb 6.4 oz (69.6 kg), SpO2 97 %.  General appearance: alert, cooperative and no distress Neck: no carotid bruit and no JVD Lungs: clear to auscultation bilaterally Heart: regular rate and rhythm, S1, S2 normal, no murmur, click, rub or gallop Extremities: extremities normal, atraumatic, no cyanosis or edema Pulses: 2+ and symmetric Skin: Skin color, texture, turgor normal. No rashes or lesions Neurologic: Grossly normal  EKG not performed-- personally reviewed   ASSESSMENT AND PLAN:   1.  Severe mitral regurgitation: Status post minimally invasive mitral valve repair by Dr. Roxy Manns February 2020.  He is doing well.  No postoperative complications.  He will remain on Coumadin for 3 months of therapy.  INRs are followed in our Coumadin clinic.  SBE prophylaxis.  2.  PFO: Status post  closure at time of minimally invasive mitral valve repair.  3.  Hyperlipidemia: Started on pravastatin by PCP.  He has been taking this religiously for the past 3 months.  His last lipid panel on file was from February 2019.  LDL was significantly elevated at 182 mg/dL.  Luckily he has no coronary artery disease based on recent cardiac catheterization.  However we recommend continuation of statin therapy for LDL reduction for primary prevention.  LDL goal less than 100.  Unfortunately he is not fasting today.  Will come back for repeat fasting lipid panel next week.  Hepatic enzymes are recently checked in the hospital.  ALT within normal limits.  Follow-Up w/ Dr. Irish Lack in 1 year  Brittainy Ladoris Gene, MHS [facility] 09/25/2018 11:15 AM

## 2018-09-28 ENCOUNTER — Other Ambulatory Visit: Payer: Self-pay | Admitting: Thoracic Surgery (Cardiothoracic Vascular Surgery)

## 2018-09-28 DIAGNOSIS — Z9889 Other specified postprocedural states: Secondary | ICD-10-CM

## 2018-10-01 ENCOUNTER — Encounter: Payer: Self-pay | Admitting: Thoracic Surgery (Cardiothoracic Vascular Surgery)

## 2018-10-01 ENCOUNTER — Ambulatory Visit (INDEPENDENT_AMBULATORY_CARE_PROVIDER_SITE_OTHER): Payer: Self-pay | Admitting: Thoracic Surgery (Cardiothoracic Vascular Surgery)

## 2018-10-01 ENCOUNTER — Ambulatory Visit
Admission: RE | Admit: 2018-10-01 | Discharge: 2018-10-01 | Disposition: A | Discharge status: Home / Self Care | Payer: 59 | Source: Ambulatory Visit | Attending: Thoracic Surgery (Cardiothoracic Vascular Surgery) | Admitting: Thoracic Surgery (Cardiothoracic Vascular Surgery)

## 2018-10-01 ENCOUNTER — Other Ambulatory Visit: Payer: Self-pay

## 2018-10-01 VITALS — BP 128/80 | HR 86 | Resp 20 | Ht 70.0 in | Wt 155.0 lb

## 2018-10-01 DIAGNOSIS — I059 Rheumatic mitral valve disease, unspecified: Secondary | ICD-10-CM

## 2018-10-01 DIAGNOSIS — Z9889 Other specified postprocedural states: Secondary | ICD-10-CM

## 2018-10-01 DIAGNOSIS — Z8774 Personal history of (corrected) congenital malformations of heart and circulatory system: Secondary | ICD-10-CM

## 2018-10-01 NOTE — Patient Instructions (Signed)
Continue all previous medications without any changes at this time  You may continue to gradually increase your physical activity as tolerated.  Refrain from any heavy lifting or strenuous use of your arms and shoulders until at least 8 weeks from the time of your surgery, and avoid activities that cause increased pain in your chest on the side of your surgical incision.  Otherwise you may continue to increase activities without any particular limitations.  Increase the intensity and duration of physical activity gradually.  You may return to driving an automobile as long as you are no longer requiring oral narcotic pain relievers during the daytime.  It would be wise to start driving only short distances during the daylight and gradually increase from there as you feel comfortable.  You are encouraged to enroll and participate in the outpatient cardiac rehab program beginning as soon as practical.  Endocarditis is a potentially serious infection of heart valves or inside lining of the heart.  It occurs more commonly in patients with diseased heart valves (such as patient's with aortic or mitral valve disease) and in patients who have undergone heart valve repair or replacement.  Certain surgical and dental procedures may put you at risk, such as dental cleaning, other dental procedures, or any surgery involving the respiratory, urinary, gastrointestinal tract, gallbladder or prostate gland.   To minimize your chances for develooping endocarditis, maintain good oral health and seek prompt medical attention for any infections involving the mouth, teeth, gums, skin or urinary tract.    Always notify your doctor or dentist about your underlying heart valve condition before having any invasive procedures. You will need to take antibiotics before certain procedures, including all routine dental cleanings or other dental procedures.  Your cardiologist or dentist should prescribe these antibiotics for you to  be taken ahead of time.       

## 2018-10-01 NOTE — Progress Notes (Signed)
[address]       [place] [address]             [phone]     CARDIOTHORACIC SURGERY OFFICE NOTE  Primary Cardiologist is Larae Grooms, MD PCP is Maury Dus, MD   HPI:  Patient is a 63 year old male who returns the office today for routine follow-up status post minimally invasive mitral valve repair and closure of patent foramen ovale on September 05, 2018 for treatment of mitral valve prolapse with severe symptomatic primary mitral regurgitation.  The patient's early postoperative recovery in the hospital was uneventful and he was discharged home on the fifth postoperative day.  Since hospital discharge she has done very well.  His prothrombin time and Coumadin dose has been monitored and adjusted at the Coumadin clinic.  He returns to our office today and reports that he is doing exceptionally well.  He states that he has minimal residual soreness in his chest.  He has not required any pain medication since hospital discharge.  He is already driving a car.  Appetite is normal.  He is sleeping well at night.  He has no shortness of breath.  He is delighted with his progress.   Current Outpatient Medications  Medication Sig Dispense Refill  . acetaminophen (TYLENOL) 500 MG tablet Take 1,000 mg by mouth every 6 (six) hours as needed for moderate pain or headache.     Marland Kitchen amoxicillin (AMOXIL) 500 MG capsule Take 4 caps by mouth 1 hour prior to dental appt    . aspirin EC 81 MG EC tablet Take 1 tablet (81 mg total) by mouth daily.    . pravastatin (PRAVACHOL) 40 MG tablet Take 40 mg by mouth daily.    Marland Kitchen warfarin (COUMADIN) 5 MG tablet Take 1 tablet (5 mg total) by mouth daily at 6 PM. Or as directed 30 tablet 1   No current facility-administered medications for this visit.       Physical Exam:   BP 128/80   Pulse 86   Resp 20   Ht 5\' 10"  (1.778 m)   Wt 155 lb (70.3 kg)   SpO2 97% Comment: RA  BMI 22.24 kg/m   General:  Well-appearing  Chest:    Clear to auscultation  CV:   Regular rate and rhythm without murmur  Incisions:  Healing nicely  Abdomen:  Soft nontender  Extremities:  Warm and well-perfused  Diagnostic Tests:  CHEST - 2 VIEW  COMPARISON:  Chest x-ray dated September 10, 2018.  FINDINGS: The heart size and mediastinal contours are within normal limits. Prior mitral valve repair. Normal pulmonary vascularity. Improved aeration of the right lung base. No focal consolidation, pleural effusion, or pneumothorax. No acute osseous abnormality.  IMPRESSION: No active cardiopulmonary disease.   Electronically Signed   By: Titus Dubin M.D.   On: 10/01/2018 12:32    Impression:  Patient is doing very well just 1 month status post minimally invasive mitral valve repair  Plan:  We have not recommended any change to the patient's current medications.  Have encouraged the patient to continue to gradually increase his physical activity as tolerated.  All of his questions been addressed.  The patient will return to our office for routine follow-up in approximately 2 months.  He can probably stop taking Coumadin at that time if there have been no signs or symptoms to suggest the development of atrial fibrillation.    Valentina Gu. Roxy Manns, MD 10/01/2018 1:29  PM

## 2018-10-02 ENCOUNTER — Other Ambulatory Visit: Payer: 59

## 2018-10-02 ENCOUNTER — Ambulatory Visit (INDEPENDENT_AMBULATORY_CARE_PROVIDER_SITE_OTHER): Payer: 59 | Admitting: Pharmacist

## 2018-10-02 DIAGNOSIS — I059 Rheumatic mitral valve disease, unspecified: Secondary | ICD-10-CM | POA: Diagnosis not present

## 2018-10-02 DIAGNOSIS — Z8774 Personal history of (corrected) congenital malformations of heart and circulatory system: Secondary | ICD-10-CM

## 2018-10-02 DIAGNOSIS — Z9889 Other specified postprocedural states: Secondary | ICD-10-CM

## 2018-10-02 DIAGNOSIS — E785 Hyperlipidemia, unspecified: Secondary | ICD-10-CM

## 2018-10-02 DIAGNOSIS — Z5181 Encounter for therapeutic drug level monitoring: Secondary | ICD-10-CM | POA: Diagnosis not present

## 2018-10-02 LAB — POCT INR: INR: 1.9 — AB (ref 2.0–3.0)

## 2018-10-02 LAB — LIPID PANEL
Chol/HDL Ratio: 3.5 ratio (ref 0.0–5.0)
Cholesterol, Total: 205 mg/dL — ABNORMAL HIGH (ref 100–199)
HDL: 59 mg/dL (ref >39)
LDL Calculated: 131 mg/dL — ABNORMAL HIGH (ref 0–99)
TRIGLYCERIDES: 75 mg/dL (ref 0–149)
VLDL Cholesterol Cal: 15 mg/dL (ref 5–40)

## 2018-10-02 MED ORDER — WARFARIN SODIUM 5 MG PO TABS: ORAL_TABLET | ORAL | 1 refills | Status: DC

## 2018-10-02 NOTE — Patient Instructions (Signed)
Description   Start taking 1.5 tablets daily except 1 tablet on Sundays. Recheck INR in 1 week. Coumadin Clinic [phone] Main [phone]

## 2018-10-03 ENCOUNTER — Telehealth: Payer: Self-pay

## 2018-10-03 NOTE — Telephone Encounter (Signed)
Patient returned call

## 2018-10-03 NOTE — Telephone Encounter (Signed)
LMTCB

## 2018-10-03 NOTE — Telephone Encounter (Signed)
-----   Message from Consuelo Pandy, [place] sent at 10/03/2018  3:52 PM EDT ----- LDL (bad cholesterol) is elevated at 131 mg/dL. This should be < 100. I recommend stopping pravastatin and try treating with atorvastatin 20 mg. Repeat FLP and HFTs in 8 weeks.

## 2018-10-03 NOTE — Telephone Encounter (Signed)
LMTCB again.Marland Kitchen advised pt that we open back up in the morning at 8am.

## 2018-10-04 ENCOUNTER — Telehealth: Payer: Self-pay

## 2018-10-04 DIAGNOSIS — E785 Hyperlipidemia, unspecified: Secondary | ICD-10-CM

## 2018-10-04 NOTE — Telephone Encounter (Signed)
-----   Message from Consuelo Pandy, [place] sent at 10/03/2018  3:52 PM EDT ----- LDL (bad cholesterol) is elevated at 131 mg/dL. This should be < 100. I recommend stopping pravastatin and try treating with atorvastatin 20 mg. Repeat FLP and HFTs in 8 weeks.

## 2018-10-04 NOTE — Telephone Encounter (Signed)
I spoke to the patient and shared his results with him.  He does not want to change his statin at this time, but will come in for labs on 5/19.

## 2018-10-04 NOTE — Telephone Encounter (Signed)
Notes recorded by Frederik Schmidt, RN on 10/04/2018 at 8:08 AM EDT The patient has been notified of the result and verbalized understanding. He does not want to stop Pravastatin, because he says that he recently started it and wants to check his labs first on 5/19 and then may change. All questions (if any) were answered. Frederik Schmidt, RN 10/04/2018 8:06 AM

## 2018-10-08 ENCOUNTER — Telehealth: Payer: Self-pay

## 2018-10-08 NOTE — Telephone Encounter (Signed)

## 2018-10-09 ENCOUNTER — Other Ambulatory Visit: Payer: Self-pay

## 2018-10-09 ENCOUNTER — Ambulatory Visit (INDEPENDENT_AMBULATORY_CARE_PROVIDER_SITE_OTHER): Payer: 59 | Admitting: Pharmacist

## 2018-10-09 DIAGNOSIS — Z9889 Other specified postprocedural states: Secondary | ICD-10-CM | POA: Diagnosis not present

## 2018-10-09 DIAGNOSIS — I059 Rheumatic mitral valve disease, unspecified: Secondary | ICD-10-CM

## 2018-10-09 DIAGNOSIS — Z5181 Encounter for therapeutic drug level monitoring: Secondary | ICD-10-CM | POA: Diagnosis not present

## 2018-10-09 DIAGNOSIS — Z8774 Personal history of (corrected) congenital malformations of heart and circulatory system: Secondary | ICD-10-CM | POA: Diagnosis not present

## 2018-10-09 LAB — POCT INR: INR: 2.2 (ref 2.0–3.0)

## 2018-10-18 ENCOUNTER — Telehealth: Payer: Self-pay

## 2018-10-18 NOTE — Telephone Encounter (Signed)
1. Do you currently have a fever? Ni (yes = cancel and refer to pcp for e-visit) 2. Have you recently travelled on a cruise, internationally, or to [place], [place], [place], [place], [place], or [place], [place] [employer]) ? No (yes = cancel, stay home, monitor symptoms, and contact pcp or initiate e-visit if symptoms develop) 3. Have you been in contact with someone that is currently pending confirmation of Covid19 testing or has been confirmed to have the [place] virus?  No (yes = cancel, stay home, away from tested individual, monitor symptoms, and contact pcp or initiate e-visit if symptoms develop) 4. Are you currently experiencing fatigue or cough? No (yes = pt should be prepared to have a mask placed at the time of their visit).  Pt. Advised that we are restricting visitors at this time and anyone present in the vehicle should meet the above criteria as well. Advised that visit will be at curbside for finger stick ONLY and will receive call with instructions. Pt also advised to please bring own pen for signature of arrival document.

## 2018-10-18 NOTE — Telephone Encounter (Signed)
lmom for prescreen/drive up 

## 2018-10-19 ENCOUNTER — Ambulatory Visit (INDEPENDENT_AMBULATORY_CARE_PROVIDER_SITE_OTHER): Payer: 59

## 2018-10-19 ENCOUNTER — Other Ambulatory Visit: Payer: Self-pay

## 2018-10-19 DIAGNOSIS — Z8774 Personal history of (corrected) congenital malformations of heart and circulatory system: Secondary | ICD-10-CM | POA: Diagnosis not present

## 2018-10-19 DIAGNOSIS — Z9889 Other specified postprocedural states: Secondary | ICD-10-CM

## 2018-10-19 DIAGNOSIS — I059 Rheumatic mitral valve disease, unspecified: Secondary | ICD-10-CM

## 2018-10-19 DIAGNOSIS — Z5181 Encounter for therapeutic drug level monitoring: Secondary | ICD-10-CM | POA: Diagnosis not present

## 2018-10-19 LAB — POCT INR: INR: 2.2 (ref 2.0–3.0)

## 2018-10-19 NOTE — Patient Instructions (Signed)
Description   Spoke with pt advised to continue on same dosage 1.5 tablets daily except 1 tablet on Sundays. Recheck INR in 3 weeks. Coumadin Clinic [phone] Main [phone]

## 2018-11-07 ENCOUNTER — Telehealth: Payer: Self-pay

## 2018-11-07 NOTE — Telephone Encounter (Signed)

## 2018-11-09 ENCOUNTER — Ambulatory Visit (INDEPENDENT_AMBULATORY_CARE_PROVIDER_SITE_OTHER): Payer: 59 | Admitting: Pharmacist

## 2018-11-09 ENCOUNTER — Other Ambulatory Visit: Payer: Self-pay

## 2018-11-09 DIAGNOSIS — Z5181 Encounter for therapeutic drug level monitoring: Secondary | ICD-10-CM | POA: Diagnosis not present

## 2018-11-09 DIAGNOSIS — Z9889 Other specified postprocedural states: Secondary | ICD-10-CM

## 2018-11-09 DIAGNOSIS — I059 Rheumatic mitral valve disease, unspecified: Secondary | ICD-10-CM | POA: Diagnosis not present

## 2018-11-09 DIAGNOSIS — Z8774 Personal history of (corrected) congenital malformations of heart and circulatory system: Secondary | ICD-10-CM | POA: Diagnosis not present

## 2018-11-09 LAB — POCT INR: INR: 2.9 (ref 2.0–3.0)

## 2018-11-23 ENCOUNTER — Other Ambulatory Visit: Payer: Self-pay | Admitting: Interventional Cardiology

## 2018-11-26 ENCOUNTER — Other Ambulatory Visit: Payer: Self-pay

## 2018-11-26 ENCOUNTER — Telehealth (INDEPENDENT_AMBULATORY_CARE_PROVIDER_SITE_OTHER): Payer: Self-pay | Admitting: Thoracic Surgery (Cardiothoracic Vascular Surgery)

## 2018-11-26 DIAGNOSIS — Z9889 Other specified postprocedural states: Secondary | ICD-10-CM

## 2018-11-26 NOTE — Progress Notes (Signed)
He will need echo in 6 weeks or so.  S/p MV repair

## 2018-11-26 NOTE — Progress Notes (Signed)
      [address]       [place] [address]             [phone]     CARDIOTHORACIC SURGERY TELEPHONE VIRTUAL OFFICE NOTE  Referring Provider is Jettie Booze, MD PCP is Maury Dus, MD   HPI:  I spoke with John Page (DOB May 26, 1956 ) via telephone on 11/26/2018 at 12:54 PM and verified that I was speaking with the correct person using more than one form of identification.  We discussed the reason(s) for conducting our visit virtually instead of in-person.  The patient expressed understanding the circumstances and agreed to proceed as described.  Patient is a 63 year old male who underwent minimally invasive mitral valve repair and closure of patent foramen ovale on September 05, 2018 for mitral valve prolapse with severe symptomatic primary mitral regurgitation.  The patient's postoperative recovery has been uneventful and he was last seen here in our office on October 01, 2018.  I telephoned him today to check to see how he is progressing.  He reports that he is doing exceptionally well.  He states that he feels dramatically better than he did prior to surgery.  His exercise tolerance is very good.  He no longer has any pain in his chest.  He reports no exertional shortness of breath.  Overall he has no complaints and he is very pleased with his progress.    Current Outpatient Medications  Medication Sig Dispense Refill  . acetaminophen (TYLENOL) 500 MG tablet Take 1,000 mg by mouth every 6 (six) hours as needed for moderate pain or headache.     Marland Kitchen amoxicillin (AMOXIL) 500 MG capsule Take 4 caps by mouth 1 hour prior to dental appt    . aspirin EC 81 MG EC tablet Take 1 tablet (81 mg total) by mouth daily.    . pravastatin (PRAVACHOL) 40 MG tablet Take 40 mg by mouth daily.    Marland Kitchen warfarin (COUMADIN) 5 MG tablet TAKE 1.5 TABLETS DAILY OR AS DIRECTED BY COUMADIN CLINIC 45 tablet 2   No current facility-administered medications for this visit.       Diagnostic Tests:  n/a   Impression:  Patient is doing very well more than 40-month status post minimally invasive mitral valve repair  Plan:  I have instructed the patient that he may stop taking warfarin.  He will continue aspirin 81 mg daily.  We have otherwise not made any changes to his current medications.  I have encouraged the patient to continue to gradually increase his physical activity without any particular limitations.  At some point the patient will need routine follow-up echocardiogram performed.  We will plan to have 1 scheduled in approximately 4 to 6 weeks at Dr. Hassell Done office.  The patient will continue to follow-up with Dr. Irish Lack and return to our office in the future should specific problems or questions arise.  Otherwise we will plan to see him back next February, approximately 1 year following his surgery for routine follow-up.    I discussed limitations of evaluation and management via telephone.  The patient was advised to call back for repeat telephone consultation or to seek an in-person evaluation if questions arise or the patient's clinical condition changes in any significant manner.  I spent in excess of 5 minutes of non-face-to-face time during the conduct of this telephone virtual office consultation.    Valentina Gu. Roxy Manns, MD 11/26/2018 12:54 PM

## 2018-11-26 NOTE — Patient Instructions (Signed)
You may stop taking warfarin  You may resume unrestricted physical activity without any particular limitations at this time.

## 2018-11-30 ENCOUNTER — Telehealth: Payer: Self-pay

## 2018-11-30 DIAGNOSIS — Z9889 Other specified postprocedural states: Secondary | ICD-10-CM

## 2018-11-30 NOTE — Telephone Encounter (Signed)
Echo scheduled for 01/07/19 at 7:30 AM.      COVID-19 Pre-Screening Questions:  . In the past 7 to 10 days have you had a cough,  shortness of breath, headache, congestion, fever, body aches, chills, sore throat, or sudden loss of taste or sense of smell? NO . Have you been around anyone with known Covid 19? NO . Have you been around anyone who is awaiting Covid 19 test results in the past 7 to 10 days? NO . Have you been around anyone who has been exposed to Covid 19, or has mentioned symptoms of Covid 19 within the past 7 to 10 days? NO               .

## 2018-11-30 NOTE — Telephone Encounter (Signed)
Jettie Booze, MD (Physician)  He will need echo in 6 weeks or so.  S/p MV repair

## 2018-11-30 NOTE — Telephone Encounter (Signed)
-----   Message from Jettie Booze, MD sent at 11/26/2018  1:20 PM EDT -----   ----- Message ----- From: Rexene Alberts, MD Sent: 11/26/2018   1:02 PM EDT To: Jettie Booze, MD

## 2018-12-04 ENCOUNTER — Other Ambulatory Visit: Payer: 59

## 2019-01-07 ENCOUNTER — Ambulatory Visit (HOSPITAL_COMMUNITY): Payer: Medicare Other | Attending: Cardiovascular Disease

## 2019-01-07 ENCOUNTER — Other Ambulatory Visit: Payer: Self-pay

## 2019-01-07 DIAGNOSIS — I358 Other nonrheumatic aortic valve disorders: Secondary | ICD-10-CM | POA: Diagnosis not present

## 2019-01-07 DIAGNOSIS — E785 Hyperlipidemia, unspecified: Secondary | ICD-10-CM | POA: Insufficient documentation

## 2019-01-07 DIAGNOSIS — Z9889 Other specified postprocedural states: Secondary | ICD-10-CM | POA: Diagnosis present

## 2019-01-08 ENCOUNTER — Telehealth: Payer: Self-pay | Admitting: Interventional Cardiology

## 2019-01-08 NOTE — Telephone Encounter (Signed)
-----   Message from Jettie Booze, MD sent at 01/07/2019  3:22 PM EDT ----- Normal LV function.  Mitral valve repair functioning well.

## 2019-01-08 NOTE — Telephone Encounter (Signed)
New message:    Patient returning call back from yesterday. Please call patient back.

## 2019-01-08 NOTE — Telephone Encounter (Signed)
The patient has been notified of the result and verbalized understanding.  All questions (if any) were answered. Cleon Gustin, RN 01/08/2019 2:10 PM

## 2019-01-18 ENCOUNTER — Other Ambulatory Visit: Payer: Self-pay | Admitting: Interventional Cardiology

## 2019-07-09 ENCOUNTER — Other Ambulatory Visit: Payer: Self-pay | Admitting: Interventional Cardiology

## 2019-07-09 NOTE — Telephone Encounter (Signed)
Refill for amoxicillin for SBE prophylaxis.

## 2019-09-02 ENCOUNTER — Encounter: Payer: 59 | Admitting: Thoracic Surgery (Cardiothoracic Vascular Surgery)

## 2019-09-09 ENCOUNTER — Other Ambulatory Visit: Payer: Self-pay

## 2019-09-09 ENCOUNTER — Telehealth (INDEPENDENT_AMBULATORY_CARE_PROVIDER_SITE_OTHER): Payer: Medicare Other | Admitting: Thoracic Surgery (Cardiothoracic Vascular Surgery)

## 2019-09-09 DIAGNOSIS — Z9889 Other specified postprocedural states: Secondary | ICD-10-CM

## 2019-09-09 DIAGNOSIS — I341 Nonrheumatic mitral (valve) prolapse: Secondary | ICD-10-CM | POA: Diagnosis not present

## 2019-09-09 NOTE — Patient Instructions (Signed)

## 2019-09-09 NOTE — Progress Notes (Signed)
[address]       [place] [address]             [phone]     CARDIOTHORACIC SURGERY TELEPHONE VIRTUAL OFFICE NOTE  Referring Provider is Jettie Booze, MD Primary Cardiologist is Larae Grooms, MD PCP is Maury Dus, MD   HPI:  I spoke with John Page (DOB 06-Mar-1956 ) via telephone on 09/09/2019 at 4:12 PM and verified that I was speaking with the correct person using more than one form of identification.  We discussed the reason(s) for conducting our visit virtually instead of in-person.  The patient expressed understanding the circumstances and agreed to proceed as described.   Patient is a 64 year old male who underwent minimally invasive mitral valve repair and closure of patent foramen ovale on September 05, 2018 for mitral valve prolapse with severe symptomatic primary mitral regurgitation.  The patient's postoperative recovery was uneventful and I last spoke with him over the telephone on Nov 26, 2018 at which time he was doing well.  Since then he underwent routine follow-up echocardiogram on January 07, 2019 which revealed intact mitral valve repair with no residual mitral regurgitation and normal left ventricular systolic function.  Ejection fraction was estimated 60 to 65%.  Mean transvalvular gradient across the mitral valve was estimated 4 mmHg.  I spoke with the patient over the telephone today to check to see how he is doing now approximately 1 year following his surgery.  He reports that he is doing extremely well.  He states that he feels "much improved" in comparison with how he felt prior to surgery.  He now reports no exertional shortness of breath whatsoever.  He reports normal activity and has no physical limitations.  He does not experience any sort of chest pain or chest discomfort.  Overall he is delighted with his outcome.   Current Outpatient Medications  Medication Sig Dispense Refill  . acetaminophen (TYLENOL) 500 MG  tablet Take 1,000 mg by mouth every 6 (six) hours as needed for moderate pain or headache.     Marland Kitchen amoxicillin (AMOXIL) 500 MG capsule 4 CAPSULES 1 HOUR PRIOR TO DENTAL 4 capsule 3  . aspirin EC 81 MG EC tablet Take 1 tablet (81 mg total) by mouth daily.    . pravastatin (PRAVACHOL) 40 MG tablet Take 40 mg by mouth daily.     No current facility-administered medications for this visit.     Diagnostic Tests:  ECHOCARDIOGRAM REPORT       Patient Name:  John Page Date of Exam: 01/07/2019  Medical Rec #: [id number]   Height:    70.0 in  Accession #:  [id number]   Weight:    155.0 lb  Date of Birth: [birth year]   BSA:     1.87 m  Patient Age:  69 years    BP:      141/91 mmHg  Patient Gender: M       HR:      61 bpm.  Exam Location: [facility]     Procedure: 2D Echo, Color Doppler and Limited Color Doppler   Indications:  Z98.890 s/p MVR    History:    Patient has prior history of Echocardiogram examinations,  most         recent 09/05/2018. S/p patent foramen ovale closure 09/05/18         Signs/Symptoms: Murmur Risk Factors: HLD. Mitral Valve: A  artificial Gore-tex neochords x6 and 30 mm sorin memo 4d  ring         annuloplasty valve is present in the mitral position.    Sonographer:  Marygrace Drought RCS  Referring Phys: [place]    1. The left ventricle has normal systolic function with an ejection  fraction of 60-65%. The cavity size was normal. Left ventricular diastolic  Doppler parameters are indeterminate.  2. The right ventricle has normal systolic function. The cavity was  normal. There is no increase in right ventricular wall thickness.  3. A artificial Gore-tex neochords x6 and 30 mm sorin memo 4d ring  annuloplasty valve is present in the mitral position.  4. Post repair with ring and neo chord no significant residual MR mean   gradient across repair in diastole 4 mmHg.  5. The aortic valve is tricuspid. Moderate thickening of the aortic  valve. Sclerosis without any evidence of stenosis of the aortic valve.  6. There is mild dilatation of the aortic root measuring 39 mm.   FINDINGS  Left Ventricle: The left ventricle has normal systolic function, with an  ejection fraction of 60-65%. The cavity size was normal. There is no  increase in left ventricular wall thickness. Left ventricular diastolic  Doppler parameters are indeterminate.   Right Ventricle: The right ventricle has normal systolic function. The  cavity was normal. There is no increase in right ventricular wall  thickness.   Left Atrium: Left atrial size was normal in size.   Right Atrium: Right atrial size was normal in size. Right atrial pressure  is estimated at 3 mmHg.   Interatrial Septum: No atrial level shunt detected by color flow Doppler.   Pericardium: There is no evidence of pericardial effusion.   Mitral Valve: The mitral valve has been repaired/replaced. Mitral valve  regurgitation is not visualized by color flow Doppler. A artificial  Gore-tex neochords x6 and 30 mm sorin memo 4d ring annuloplasty valve is  present in the mitral position. Post  repair with ring and neo chord no significant residual MR mean gradient  across repair in diastole 4 mmHg.   Tricuspid Valve: The tricuspid valve is normal in structure. Tricuspid  valve regurgitation is mild by color flow Doppler.   Aortic Valve: The aortic valve is tricuspid Moderate thickening of the  aortic valve. Sclerosis without any evidence of stenosis of the aortic  valve. Aortic valve regurgitation was not visualized by color flow  Doppler. There is no evidence of aortic valve  stenosis.   Pulmonic Valve: The pulmonic valve was grossly normal. Pulmonic valve  regurgitation is mild by color flow Doppler.   Aorta: There is mild dilatation of the aortic root measuring 39  mm.     +--------------+--------++  LEFT VENTRICLE     +----------------+---------++  +--------------+--------++ Diastology          PLAX 2D         +----------------+---------++  +--------------+--------++ LV e' lateral: 8.81 cm/s  LVIDd:    3.65 cm  +----------------+---------++  +--------------+--------++ LV E/e' lateral:13.5     LVIDs:    2.56 cm  +----------------+---------++  +--------------+--------++ LV e' medial:  6.42 cm/s  LV PW:    0.96 cm  +----------------+---------++  +--------------+--------++ LV E/e' medial: 18.5     LV IVS:    0.94 cm  +----------------+---------++  +--------------+--------++  LVOT diam:  2.00 cm   +--------------+--------++  LV SV:    33 ml    +--------------+--------++  LV SV Index: 17.49    +--------------+--------++  LVOT Area:  3.14 cm  +--------------+--------++               +--------------+--------++   +---------------+----------++  RIGHT VENTRICLE       +---------------+----------++  RV Basal diam: 3.25 cm    +---------------+----------++  RV S prime:  10.90 cm/s  +---------------+----------++  TAPSE (M-mode):1.8 cm    +---------------+----------++  RVSP:     19.6 mmHg   +---------------+----------++   +---------------+-------++-----------++  LEFT ATRIUM      Index     +---------------+-------++-----------++  LA diam:    3.40 cm1.82 cm/m   +---------------+-------++-----------++  LA Vol (A2C): 36.6 ml19.54 ml/m  +---------------+-------++-----------++  LA Vol (A4C): 27.4 ml14.60 ml/m  +---------------+-------++-----------++  LA Biplane Vol:36.2 ml19.33 ml/m  +---------------+-------++-----------++  +------------+---------++-----------++  RIGHT ATRIUM     Index     +------------+---------++-----------++    RA Pressure:3.00 mmHg        +------------+---------++-----------++  RA Area:  11.20 cm        +------------+---------++-----------++  RA Volume: 24.60 ml 13.13 ml/m  +------------+---------++-----------++  +------------+-----------++  AORTIC VALVE        +------------+-----------++  LVOT Vmax: 120.00 cm/s  +------------+-----------++  LVOT Vmean: 80.300 cm/s  +------------+-----------++  LVOT VTI:  0.252 m    +------------+-----------++    +-------------+-------++  AORTA          +-------------+-------++  Ao Root diam:2.90 cm  +-------------+-------++   +--------------+-----------++ +---------------+-----------++  MITRAL VALVE        TRICUSPID VALVE        +--------------+-----------++ +---------------+-----------++  MV Area (PHT):2.20 cm   TR Peak grad: 16.6 mmHg   +--------------+-----------++ +---------------+-----------++  MV Peak grad: 10.3 mmHg  TR Vmax:    204.00 cm/s  +--------------+-----------++ +---------------+-----------++  MV Mean grad: 4.0 mmHg   Estimated RAP: 3.00 mmHg   +--------------+-----------++ +---------------+-----------++  MV Vmax:   1.60 m/s   RVSP:     19.6 mmHg   +--------------+-----------++ +---------------+-----------++  MV Vmean:   92.4 cm/s   +--------------+-----------++ +--------------+-------+  MV VTI:    0.58 m    SHUNTS          +--------------+-----------++ +--------------+-------+  MV PHT:    100.05 msec Systemic VTI: 0.25 m   +--------------+-----------++ +--------------+-------+  MV Decel Time:345 msec   Systemic Diam:2.00 cm  +--------------+-----------++ +--------------+-------+  +--------------+-----------++  MV E velocity:119.00 cm/s  +--------------+-----------++  MV A velocity:128.00 cm/s  +--------------+-----------++  MV  E/A ratio: 0.93      +--------------+-----------++     Jenkins Rouge MD  Electronically signed by Jenkins Rouge MD  Signature Date/Time: 01/07/2019/11:25:12 AM      Impression:  Patient is doing very well approximately 1 year status post minimally invasive mitral valve repair  Plan:  The patient will continue to follow-up intermittently with Dr. Irish Lack.  He will call and return to our office in the future only should specific problems or questions arise.  The patient has been reminded regarding the importance of dental hygiene and the lifelong need for antibiotic prophylaxis for all dental cleanings and other related invasive procedures.     I discussed limitations of evaluation and management via telephone.  The patient was advised to call back for repeat telephone consultation or to seek an in-person evaluation if questions arise or the patient's clinical condition changes in any significant manner.  I spent in excess of 5 minutes of non-face-to-face time during the conduct of this telephone virtual office consultation.  Valentina Gu. Roxy Manns, MD 09/09/2019 4:12 PM

## 2020-08-24 DIAGNOSIS — E7800 Pure hypercholesterolemia, unspecified: Secondary | ICD-10-CM | POA: Diagnosis not present

## 2020-08-24 DIAGNOSIS — N1831 Chronic kidney disease, stage 3a: Secondary | ICD-10-CM | POA: Diagnosis not present

## 2020-08-24 DIAGNOSIS — Z0000 Encounter for general adult medical examination without abnormal findings: Secondary | ICD-10-CM | POA: Diagnosis not present

## 2020-08-24 DIAGNOSIS — Z6823 Body mass index (BMI) 23.0-23.9, adult: Secondary | ICD-10-CM | POA: Diagnosis not present

## 2020-09-25 DIAGNOSIS — H524 Presbyopia: Secondary | ICD-10-CM | POA: Diagnosis not present

## 2020-09-25 DIAGNOSIS — H52203 Unspecified astigmatism, bilateral: Secondary | ICD-10-CM | POA: Diagnosis not present

## 2020-09-25 DIAGNOSIS — H2513 Age-related nuclear cataract, bilateral: Secondary | ICD-10-CM | POA: Diagnosis not present

## 2020-09-29 DIAGNOSIS — N1831 Chronic kidney disease, stage 3a: Secondary | ICD-10-CM | POA: Diagnosis not present

## 2020-10-15 DIAGNOSIS — Z85820 Personal history of malignant melanoma of skin: Secondary | ICD-10-CM | POA: Diagnosis not present

## 2020-10-15 DIAGNOSIS — Z85828 Personal history of other malignant neoplasm of skin: Secondary | ICD-10-CM | POA: Diagnosis not present

## 2020-10-15 DIAGNOSIS — D1801 Hemangioma of skin and subcutaneous tissue: Secondary | ICD-10-CM | POA: Diagnosis not present

## 2020-10-15 DIAGNOSIS — L814 Other melanin hyperpigmentation: Secondary | ICD-10-CM | POA: Diagnosis not present

## 2020-10-22 DIAGNOSIS — N1831 Chronic kidney disease, stage 3a: Secondary | ICD-10-CM | POA: Diagnosis not present

## 2020-11-19 DIAGNOSIS — N1831 Chronic kidney disease, stage 3a: Secondary | ICD-10-CM | POA: Diagnosis not present

## 2020-11-19 DIAGNOSIS — E7800 Pure hypercholesterolemia, unspecified: Secondary | ICD-10-CM | POA: Diagnosis not present

## 2020-12-04 ENCOUNTER — Encounter: Payer: Self-pay | Admitting: Interventional Cardiology

## 2020-12-04 ENCOUNTER — Other Ambulatory Visit: Payer: Self-pay

## 2020-12-04 ENCOUNTER — Telehealth (INDEPENDENT_AMBULATORY_CARE_PROVIDER_SITE_OTHER): Payer: Medicare Other | Admitting: Interventional Cardiology

## 2020-12-04 VITALS — Ht 70.0 in | Wt 158.0 lb

## 2020-12-04 DIAGNOSIS — I480 Paroxysmal atrial fibrillation: Secondary | ICD-10-CM | POA: Diagnosis not present

## 2020-12-04 DIAGNOSIS — E782 Mixed hyperlipidemia: Secondary | ICD-10-CM | POA: Diagnosis not present

## 2020-12-04 DIAGNOSIS — Z9889 Other specified postprocedural states: Secondary | ICD-10-CM

## 2020-12-04 NOTE — Progress Notes (Signed)
Virtual Visit via Telephone Note   This visit type was conducted due to national recommendations for restrictions regarding the COVID-19 Pandemic (e.g. social distancing) in an effort to limit this patient's exposure and mitigate transmission in our community.  Due to his co-morbid illnesses, this patient is at least at moderate risk for complications without adequate follow up.  This format is felt to be most appropriate for this patient at this time.  The patient did not have access to video technology/had technical difficulties with video requiring transitioning to audio format only (telephone).  All issues noted in this document were discussed and addressed.  No physical exam could be performed with this format.  Please refer to the patient's chart for his  consent to telehealth for [facility].    Date:  12/04/2020   ID:  John Page, DOB 1956/05/07, MRN [id number] The patient was identified using 2 identifiers.  Patient Location: Home Provider Location: Home Office   PCP:  Maury Dus, MD   [place] Providers Cardiologist:  Larae Grooms, MD     Evaluation Performed:  Follow-Up Visit  Chief Complaint:  S/p MV repair  History of Present Illness:    John Page is a 65 y.o. male with   who had a new heart murmur found on exam in 2013. He had an echo showing MVP with moderate MR.  History of melanoma requiring surgery.   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 MR difficult to quantitate due to eccentricity of jet that is directed anteriorly and toward the septum. Unable to perfom accurate assessment of MR with PISA. Consider TEE for better assessment. Mildly dilated ascending aorta.  No change in LV size.Normal LA size.   He had a mumps vaccine and this caused some testicular pain.  In 2020, echo showed: Left ventricle: The cavity size was normal. Wall thickness was normal. Systolic function was  normal. The estimated ejection fraction was in the range of 60% to 65%. Wall motion was normal; there were no regional wall motion abnormalities. Features are consistent with a pseudonormal left ventricular filling pattern, with concomitant abnormal relaxation and increased filling pressure (grade 2 diastolic dysfunction). Doppler parameters are consistent with high ventricular filling pressure. - Ascending aorta: The ascending aorta was mildly dilated. - Mitral valve: Calcified annulus. Mildly thickened leaflets . Severe prolapse, involving the posterior leaflet. There was severe regurgitation directed eccentrically and anteriorly. - Left atrium: The atrium was mildly dilated.  Impressions:  - Normal LV systolic function; moderate diastolic dysfunction; mildly dilated ascending aorta; probable flail posterior MV leaflet with severe eccentric MR; mild LAE.  He had MV Repair and PFO repair in 08/2018.    The patient does not have symptoms concerning for COVID-19 infection (fever, chills, cough, or new shortness of breath).   Denies : Chest pain. Dizziness. Leg edema. Nitroglycerin use. Orthopnea. Palpitations. Paroxysmal nocturnal dyspnea. Shortness of breath. Syncope.    Past Medical History:  Diagnosis Date  . Cancer ([place])    melanoma  . Heart murmur   . Mitral valve disorders(424.0)    severe MVP by 07/25/17 echo  . Other and unspecified hyperlipidemia   . 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 S/P patent foramen ovale closure 09/05/2018   Past Surgical History:  Procedure Laterality  Date  . ABDOMINAL AORTOGRAM N/A 08/24/2018   Procedure: ABDOMINAL AORTOGRAM;  Surgeon: Jettie Booze, MD;  Location: [place] [facility];  Service: Cardiovascular;   Laterality: N/A;  . CARDIAC CATHETERIZATION    . HERNIA REPAIR    . INGUINAL HERNIA REPAIR Left 10/09/2017   Procedure: LEFT INGUINAL HERNIA REPAIR;  Surgeon: Georganna Skeans, MD;  Location: [place];  Service: General;  Laterality: Left;  . INSERTION OF MESH Left 10/09/2017   Procedure: INSERTION OF MESH;  Surgeon: Georganna Skeans, MD;  Location: [place];  Service: General;  Laterality: Left;  Marland Kitchen MELANOMA EXCISION    . MITRAL VALVE REPAIR Right 09/05/2018   Procedure: MINIMALLY INVASIVE MITRAL VALVE REPAIR (MVR), USING MEMO 4D 30MM AND CLOSURE OF PATENT FORAMEN OVALE;  Surgeon: Rexene Alberts, MD;  Location: [place];  Service: Open Heart Surgery;  Laterality: Right;  . RIGHT/LEFT HEART CATH AND CORONARY ANGIOGRAPHY N/A 08/24/2018   Procedure: RIGHT/LEFT HEART CATH AND CORONARY ANGIOGRAPHY;  Surgeon: Jettie Booze, MD;  Location: [place] [facility];  Service: Cardiovascular;  Laterality: N/A;  . TEE WITHOUT CARDIOVERSION N/A 08/24/2018   Procedure: TRANSESOPHAGEAL ECHOCARDIOGRAM (TEE);  Surgeon: Fay Records, MD;  Location: [place];  Service: Cardiovascular;  Laterality: N/A;  . TEE WITHOUT CARDIOVERSION N/A 09/05/2018   Procedure: TRANSESOPHAGEAL ECHOCARDIOGRAM (TEE);  Surgeon: Rexene Alberts, MD;  Location: [facility] [facility];  Service: Open Heart Surgery;  Laterality: N/A;  . TONSILLECTOMY       Current Meds  Medication Sig  . acetaminophen (TYLENOL) 500 MG tablet Take 1,000 mg by mouth every 6 (six) hours as needed for moderate pain or headache.   Marland Kitchen amoxicillin (AMOXIL) 500 MG capsule 4 CAPSULES 1 HOUR PRIOR TO DENTAL  . aspirin EC 81 MG EC tablet Take 1 tablet (81 mg total) by mouth daily.  Marland Kitchen atorvastatin (LIPITOR) 10 MG tablet Take 1 tablet by mouth at bedtime.     Allergies:   Patient has no known allergies.   Social History   Tobacco Use  . Smoking status: Former Smoker    Quit date: 07/05/1984    Years since quitting: 36.4  . Smokeless tobacco: Never Used  Vaping Use  . Vaping Use:  Never used  Substance Use Topics  . Drug use: No     Family Hx: The patient's family history includes Heart attack in his mother; Heart disease in his mother; Hypertension in his brother and mother; Lung cancer in his father.  ROS:   Please see the history of present illness.    Works at [employer] some days, also works in the [employer] All other systems reviewed and are negative.   Prior CV studies:   The following studies were reviewed today:  2020 echo reviewed post op  Labs/Other Tests and Data Reviewed:    EKG:  An ECG dated 08/2020 was personally reviewed today and demonstrated:  Atrial fibrillation, controlled rate  Recent Labs: No results found for requested labs within last 8760 hours.   Recent Lipid Panel Lab Results  Component Value Date/Time   CHOL 205 (H) 10/02/2018 07:45 AM   TRIG 75 10/02/2018 07:45 AM   HDL 59 10/02/2018 07:45 AM   CHOLHDL 3.5 10/02/2018 07:45 AM   LDLCALC 131 (H) 10/02/2018 07:45 AM    Wt Readings from Last 3 Encounters:  12/04/20 158 lb (71.7 kg)  10/01/18 155 lb (70.3 kg)  09/25/18 153 lb 6.4 oz (69.6 kg)     Risk Assessment/Calculations:  Objective:    Vital Signs:  Ht 5\' 10"  (1.778 m)   Wt 158 lb (71.7 kg)   BMI 22.67 kg/m    VITAL SIGNS:  reviewed GEN:  no acute distress RESPIRATORY:  no shortness of breath NEURO:  alert and oriented x 3, no obvious focal deficit PSYCH:  normal affect exam limited by video format  ASSESSMENT & PLAN:    1. S/p MV repair: SBE prophylaxis.  No CHF.  Increase exercise to target below. 2. Hyperlipidemia: Continue atorvastatin.  He wants to continue aspirin.  No bleeding problems.   3. PAF: Occurred while in the hospita  He was in NSR at the time of discharge per hospital records.  Took COumadin for 3 months. 4. Will try to meet up with him at [facility] to do a physical exam.          COVID-19 Education: The signs and symptoms of COVID-19 were discussed with the patient and how to  seek care for testing (follow up with PCP or arrange E-visit).    Time:   Today, I have spent 25 minutes with the patient with telehealth technology discussing the above problems.     Medication Adjustments/Labs and Tests Ordered: Current medicines are reviewed at length with the patient today.  Concerns regarding medicines are outlined above.   Tests Ordered: No orders of the defined types were placed in this encounter.   Medication Changes: No orders of the defined types were placed in this encounter.   Follow Up:  In Person in 1 year(s)  Signed, Larae Grooms, MD  12/04/2020 11:31 AM    [place]

## 2020-12-04 NOTE — Patient Instructions (Signed)
Medication Instructions:  Your physician recommends that you continue on your current medications as directed. Please refer to the Current Medication list given to you today.  *If you need a refill on your cardiac medications before your next appointment, please call your pharmacy*   Lab Work: none If you have labs (blood work) drawn today and your tests are completely normal, you will receive your results only by: Marland Kitchen MyChart Message (if you have MyChart) OR . A paper copy in the mail If you have any lab test that is abnormal or we need to change your treatment, we will call you to review the results.   Testing/Procedures: none   Follow-Up: At [facility], you and your health needs are our priority.  As part of our continuing mission to provide you with exceptional heart care, we have created designated Provider Care Teams.  These Care Teams include your primary Cardiologist (physician) and Advanced Practice Providers (APPs -  Physician Assistants and Nurse Practitioners) who all work together to provide you with the care you need, when you need it.  We recommend signing up for the patient portal called "MyChart".  Sign up information is provided on this After Visit Summary.  MyChart is used to connect with patients for Virtual Visits (Telemedicine).  Patients are able to view lab/test results, encounter notes, upcoming appointments, etc.  Non-urgent messages can be sent to your provider as well.   To learn more about what you can do with MyChart, go to [email or web address].    Your next appointment:   12 month(s)  The format for your next appointment:   In Person  Provider:   You may see Larae Grooms, MD or one of the following Advanced Practice Providers on your designated Care Team:    Melina Copa, PA-C  Ermalinda Barrios, PA-C    Other Instructions Your physician discussed the importance of taking an antibiotic prior to any dental, gastrointestinal, genitourinary  procedures to prevent damage to the heart valves from infection. You were given a prescription for an antibiotic based on current SBE prophylaxis guidelines.

## 2020-12-13 IMAGING — CR DG CHEST 2V
2 series · 2 of 2 positions shown · non-contrast
Comparison: None.

CLINICAL DATA: Preoperative examination. Patient for mitral valve
surgery.

EXAM:
CHEST - 2 VIEW

[w chest pa]
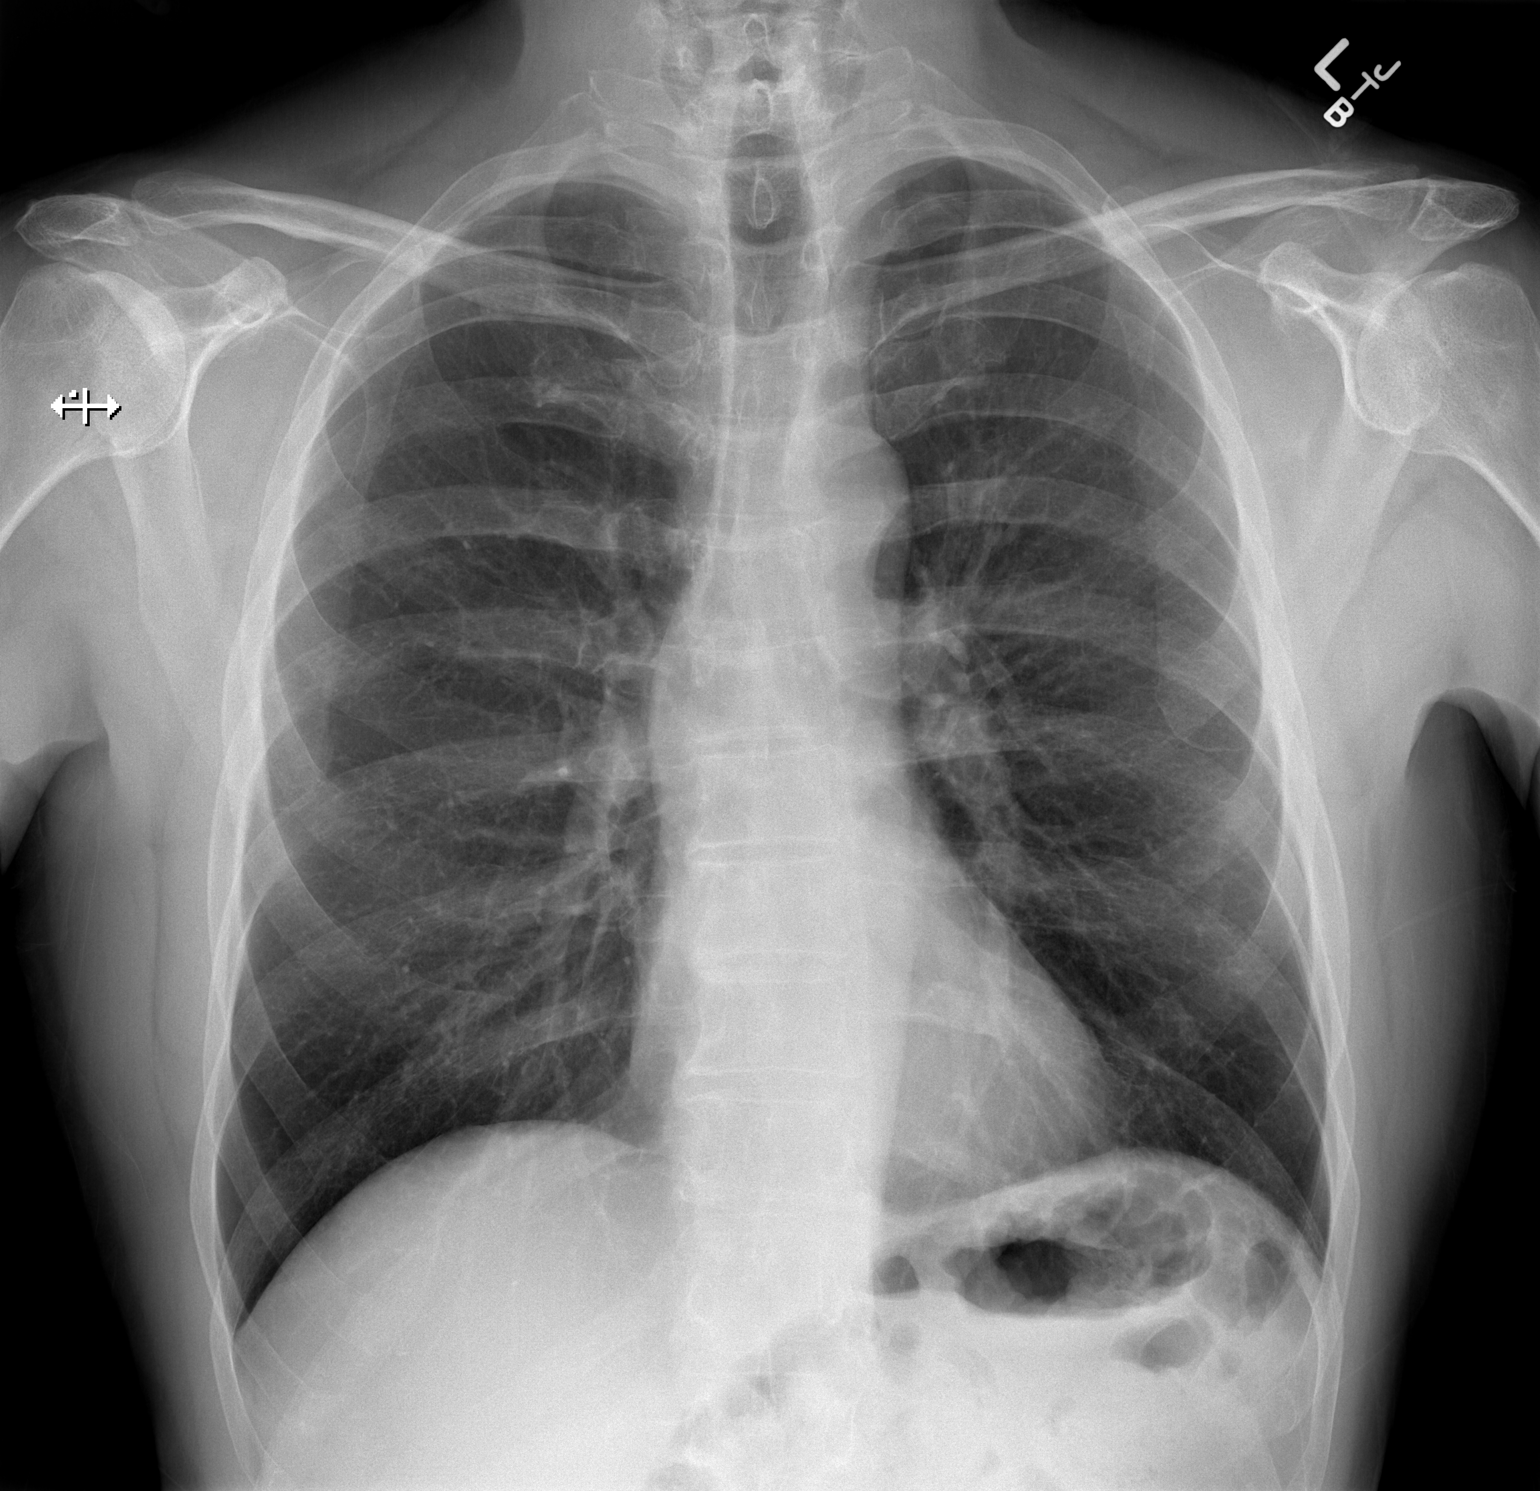

[w chest lat]
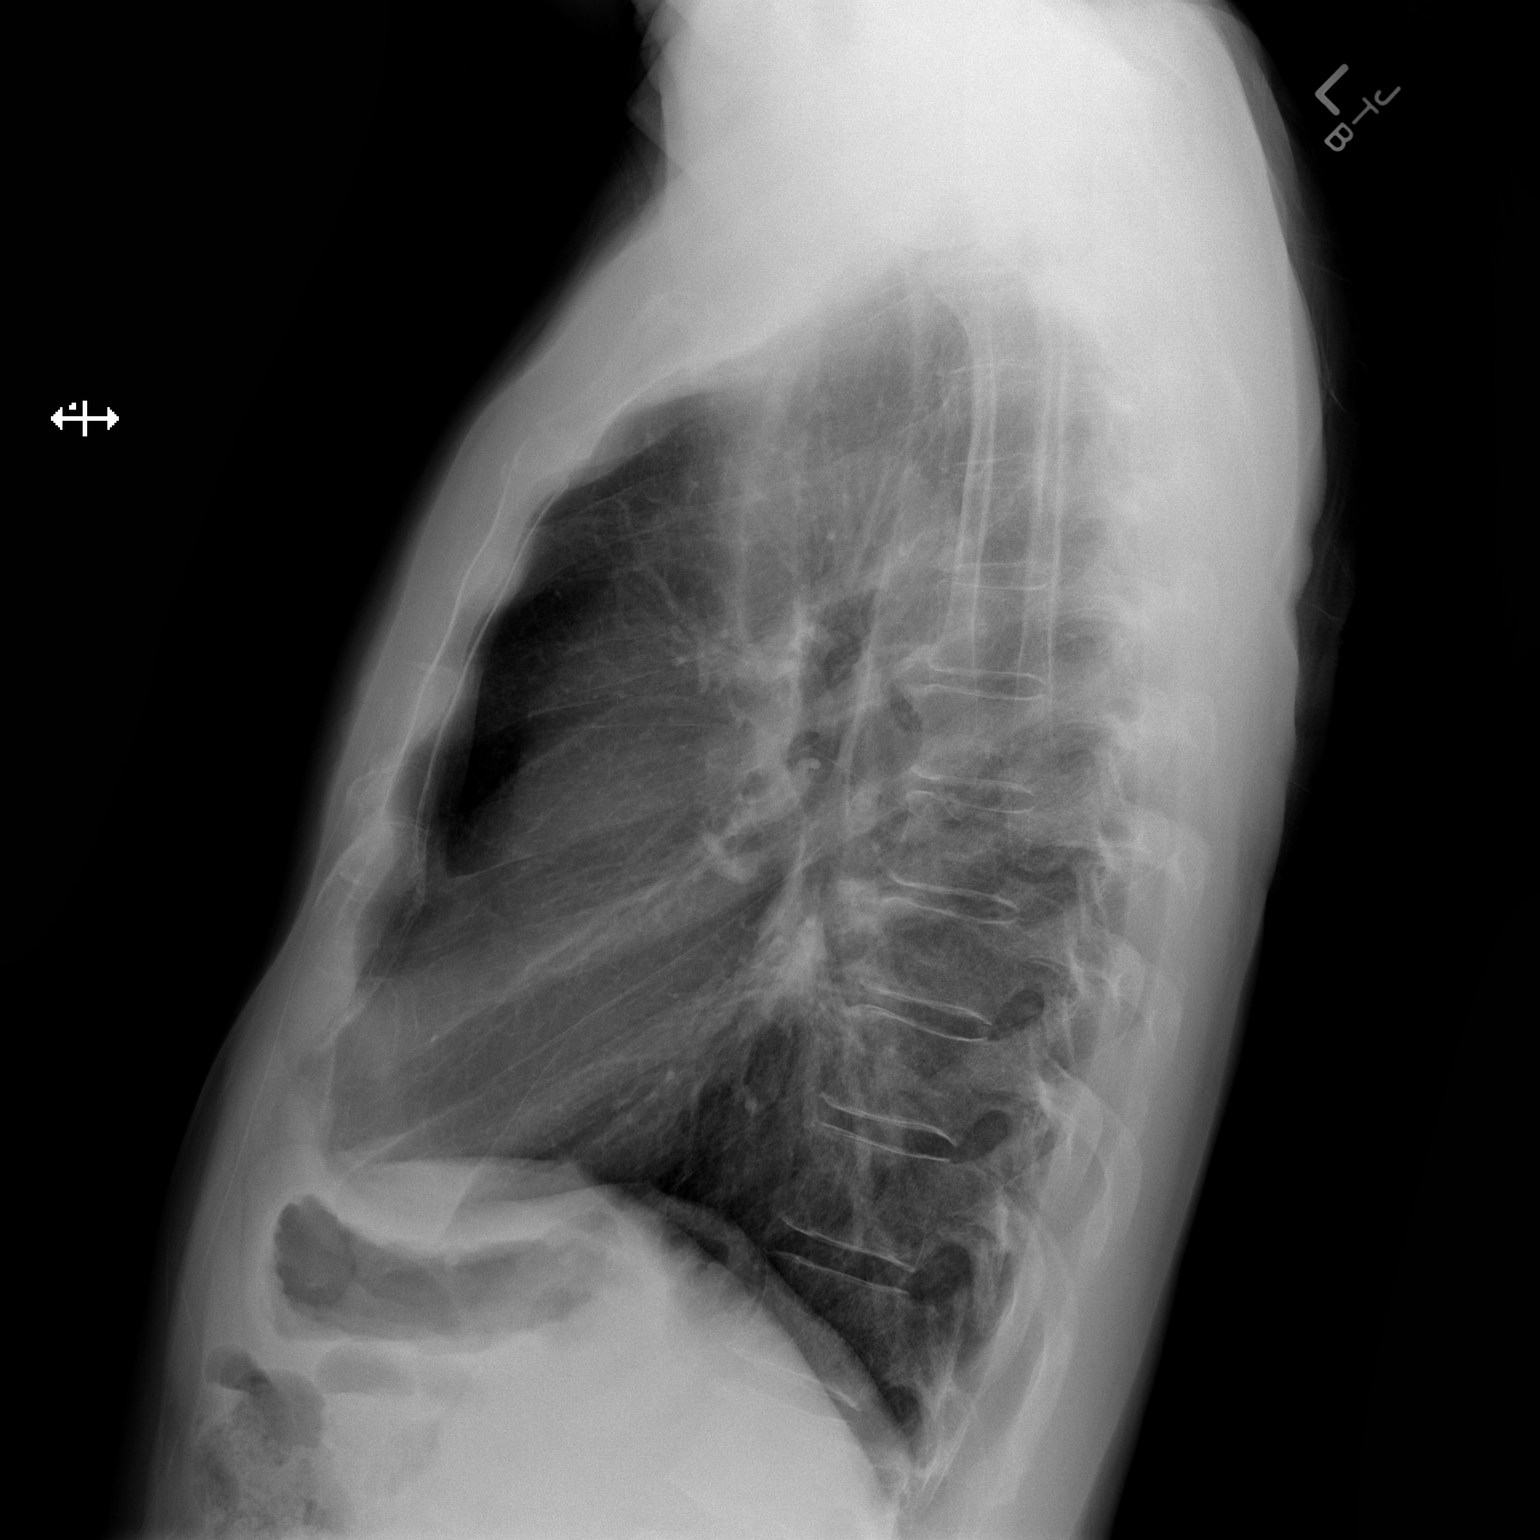

[2 of 2 positions shown; findings below may reference images not displayed]

FINDINGS: Lungs clear. Heart size normal. No pneumothorax or pleural fluid. No
acute or focal bony abnormality.
IMPRESSION: Negative chest.

## 2020-12-14 IMAGING — DX DG CHEST 1V PORT
1 series · 1 of 1 positions shown · non-contrast
Comparison: 09/04/2018

CLINICAL DATA: Status post mitral valve repair.

EXAM:
PORTABLE CHEST 1 VIEW

[chest ap]
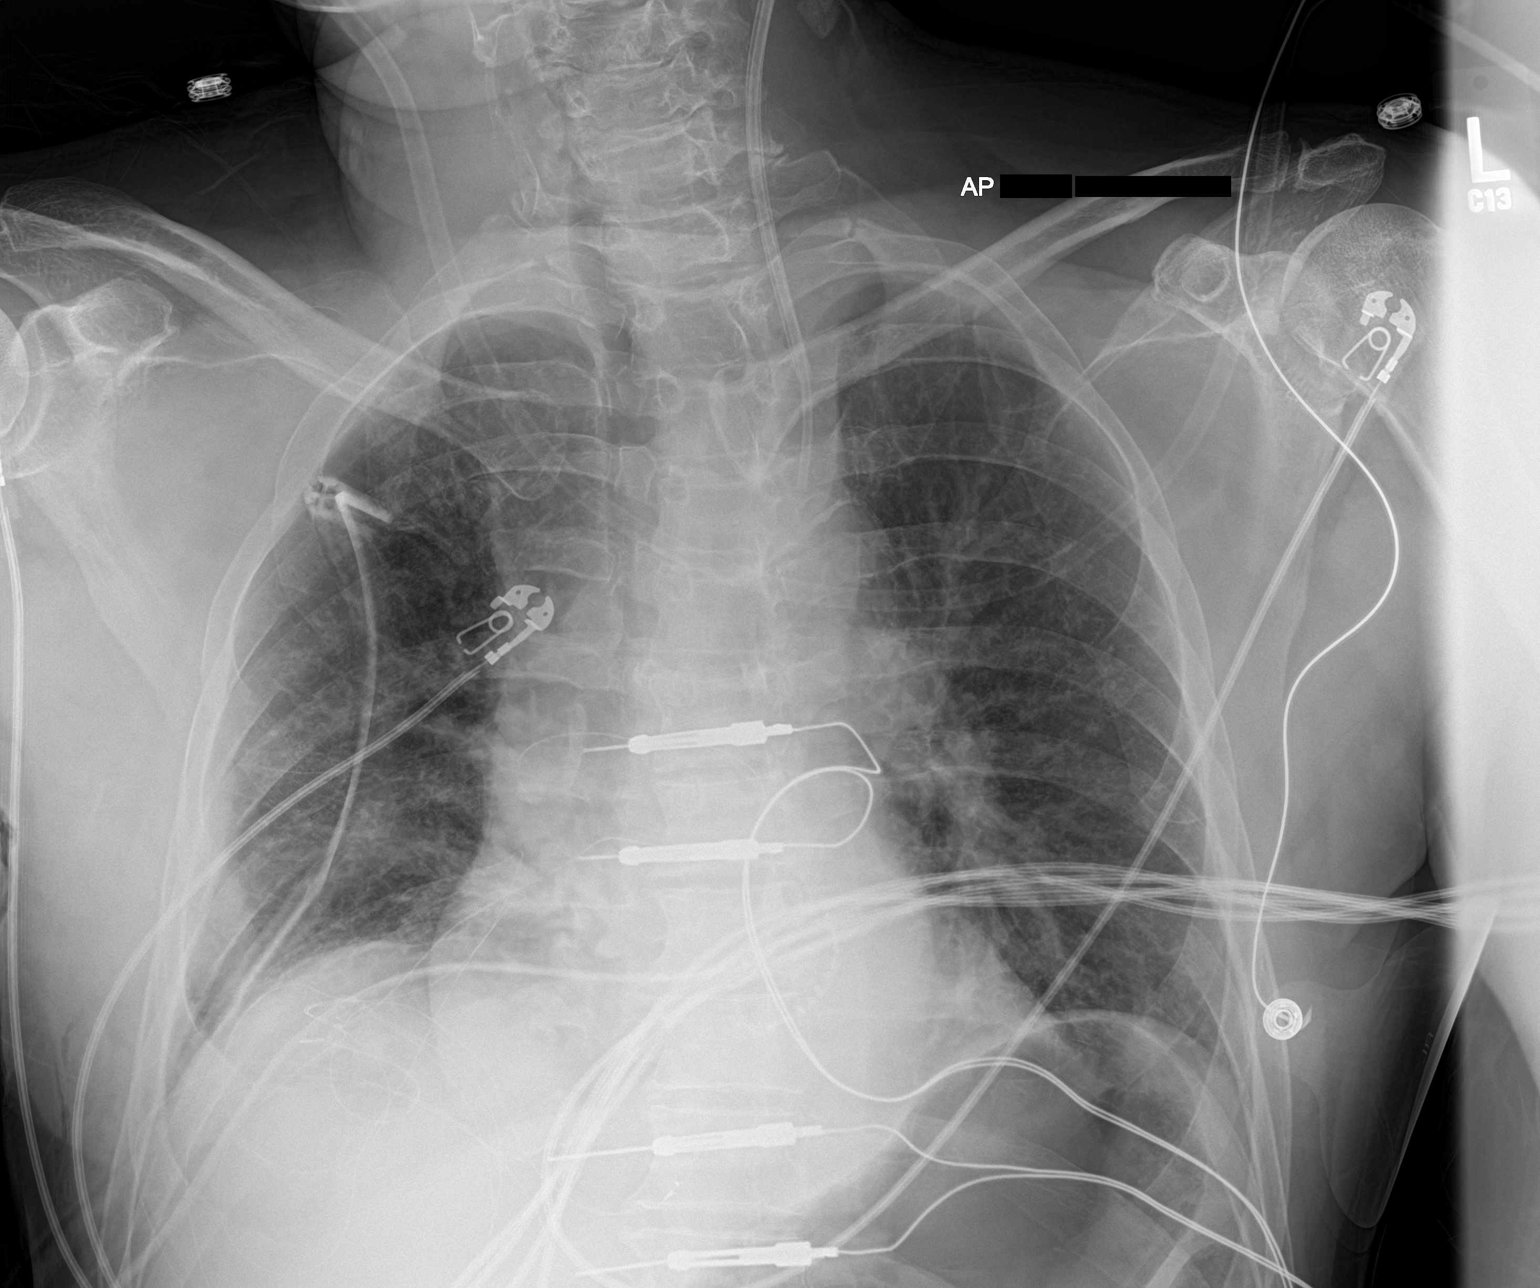

[1 of 1 positions shown; findings below may reference images not displayed]

FINDINGS: Left internal jugular central line tip is at the innominate vein
level. There has been mitral valve replacement. There is mild
bibasilar atelectasis. Upper lungs are well aerated. No edema. No
pneumothorax.
IMPRESSION: Status post mitral valve replacement. Atelectatic changes in the
lower lungs.

## 2020-12-19 IMAGING — DX DG CHEST 2V
2 series · 2 of 2 positions shown · non-contrast
Comparison: 09/07/2018

CLINICAL DATA: Follow-up pneumothorax

EXAM:
CHEST - 2 VIEW

[chest pa]
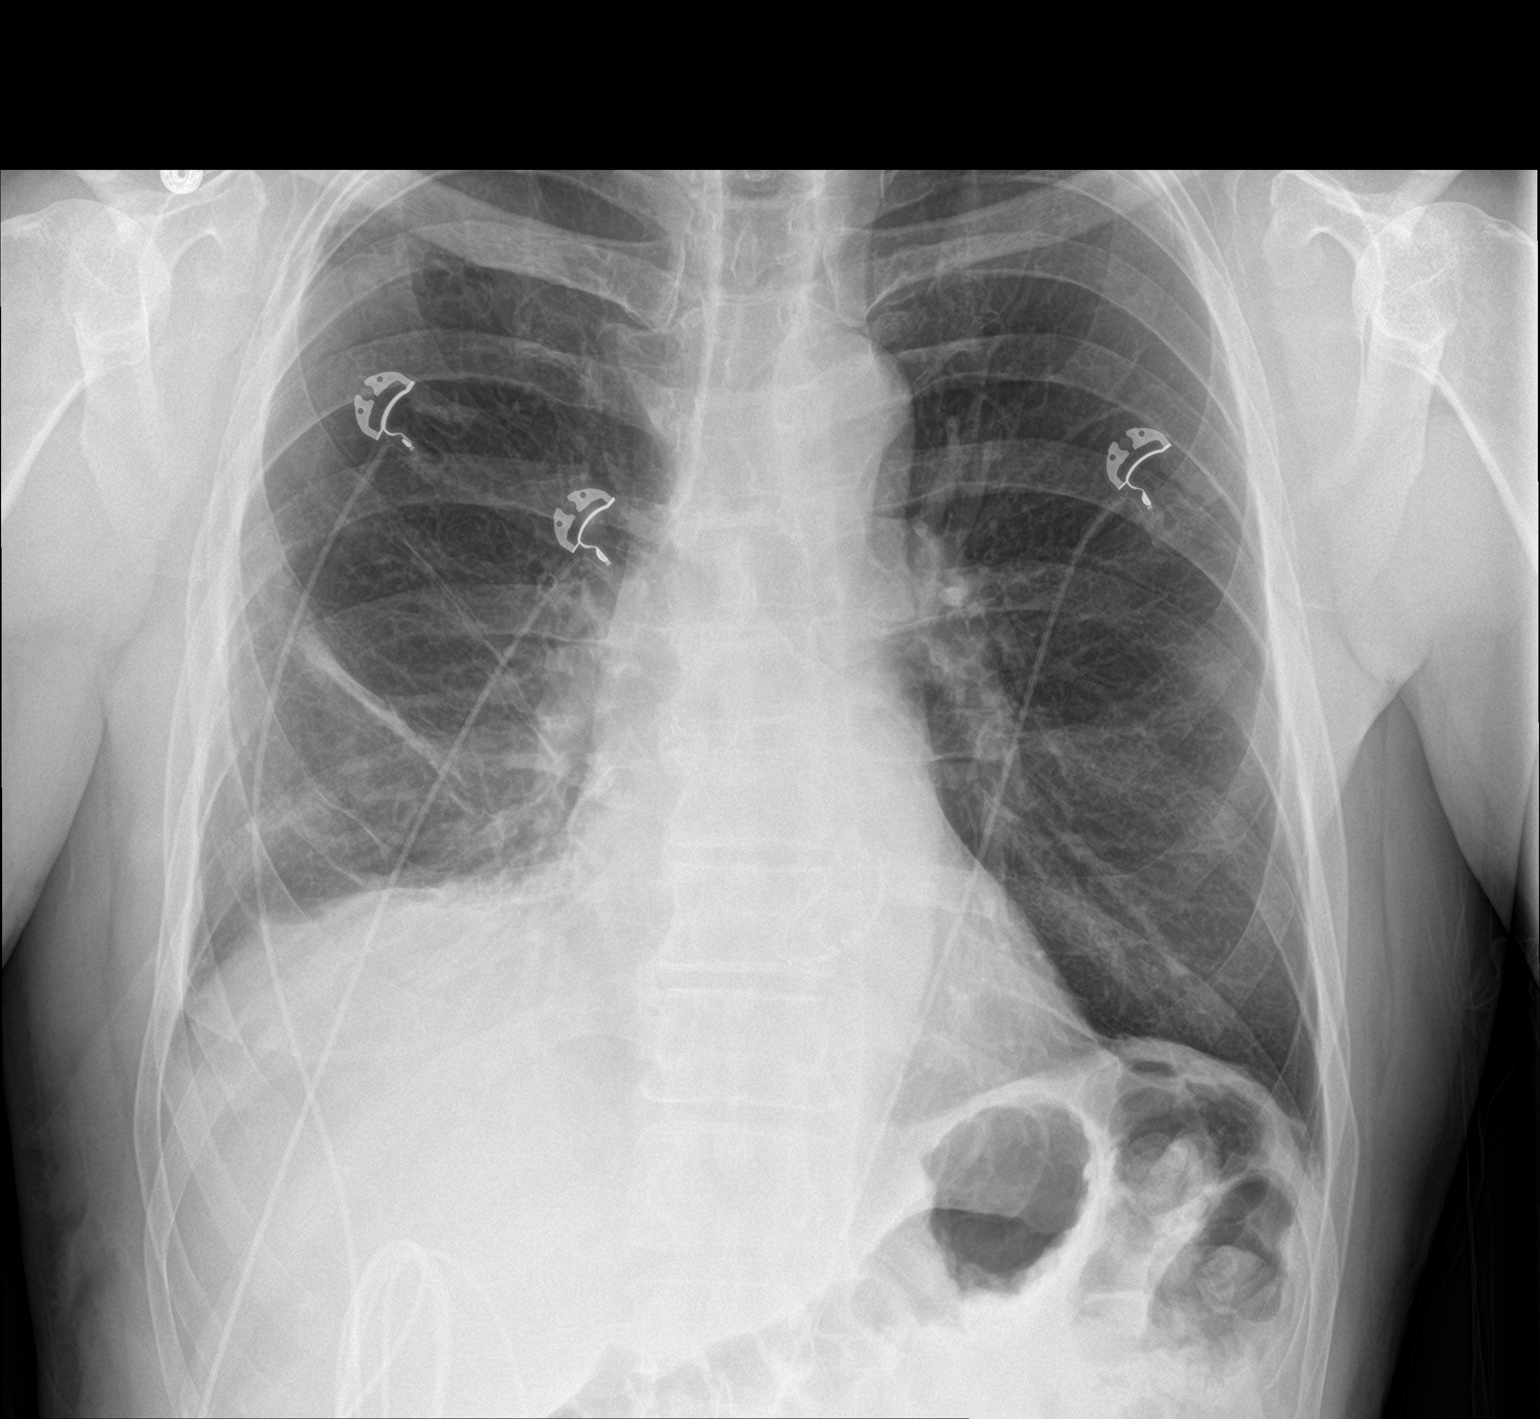

[chest lat]
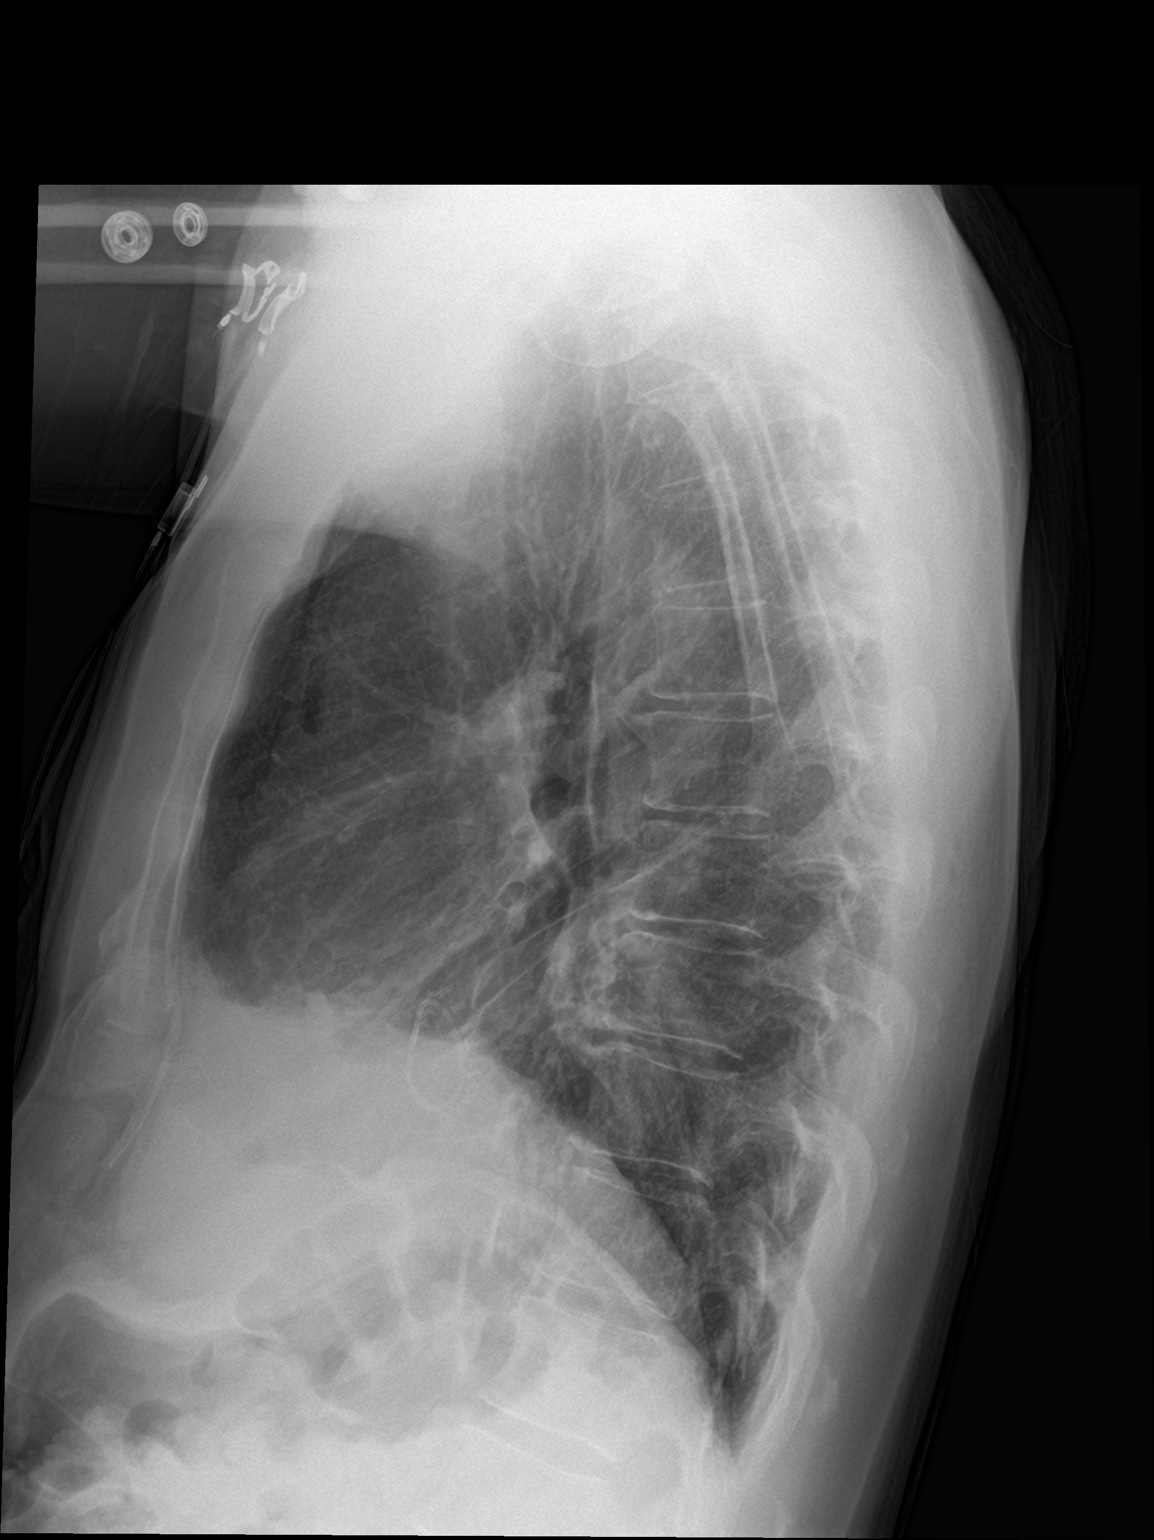

[2 of 2 positions shown; findings below may reference images not displayed]

FINDINGS: Cardiac shadow is stable. Postsurgical changes are again seen. Left
jugular central line is been removed in the interval. Right-sided
chest tubes have been removed. No significant recurrent pneumothorax
is noted. Minimal right basilar atelectasis is again noted. No bony
abnormality is seen.
IMPRESSION: No significant recurrent pneumothorax following chest tube removal
on the right.

## 2021-03-08 ENCOUNTER — Telehealth: Payer: Self-pay | Admitting: Interventional Cardiology

## 2021-03-08 DIAGNOSIS — I340 Nonrheumatic mitral (valve) insufficiency: Secondary | ICD-10-CM

## 2021-03-08 NOTE — Telephone Encounter (Signed)
Called pt back in regards to SOB.  He reports that he has had SOB with vigorous activity for about 2 years.  He called in today because his friends noticed that per pt " he runs out of gas quick".  Pt reports that after 3 games of tennis or jogging he gasp for air and has to sit down for 15 minutes to rest.  He does not have SOB with everyday activities and can walk 3-5 miles with out difficulty.  He does not have a current BP readings but reports it is normal.  He denies palpitations.   Pt would like to know if there is any type of testing he can have done for SOB with vigorous activity.

## 2021-03-08 NOTE — Telephone Encounter (Signed)
Pt c/o Shortness Of Breath: STAT if SOB developed within the last 24 hours or pt is noticeably SOB on the phone  1. Are you currently SOB (can you hear that pt is SOB on the phone)? No,   2. How long have you been experiencing SOB? Since heart valve replacement 2.5 years ago  3. Are you SOB when sitting or when up moving around? Only when pt is doing exercise   4. Are you currently experiencing any other symptoms?  No

## 2021-03-08 NOTE — Telephone Encounter (Signed)
Would check echocariogram

## 2021-03-09 NOTE — Telephone Encounter (Signed)
Spoke with the patient and scheduled him for an echocardiogram 9/8.

## 2021-03-25 ENCOUNTER — Other Ambulatory Visit: Payer: Self-pay

## 2021-03-25 ENCOUNTER — Ambulatory Visit (HOSPITAL_COMMUNITY): Payer: Medicare Other | Attending: Cardiology

## 2021-03-25 DIAGNOSIS — I340 Nonrheumatic mitral (valve) insufficiency: Secondary | ICD-10-CM | POA: Insufficient documentation

## 2021-03-29 LAB — ECHOCARDIOGRAM COMPLETE
Area-P 1/2: 2.56 cm2
MV VTI: 1.41 cm2
S' Lateral: 2.55 cm

## 2021-04-01 ENCOUNTER — Telehealth: Payer: Self-pay | Admitting: *Deleted

## 2021-04-01 DIAGNOSIS — R5383 Other fatigue: Secondary | ICD-10-CM

## 2021-04-01 DIAGNOSIS — I340 Nonrheumatic mitral (valve) insufficiency: Secondary | ICD-10-CM

## 2021-04-01 DIAGNOSIS — Z9889 Other specified postprocedural states: Secondary | ICD-10-CM

## 2021-04-01 NOTE — Telephone Encounter (Signed)
Reviewed with Dr Irish Lack and [place] to order ETT now.  I spoke with patient and reviewed echo results with him.  He would like to proceed with ETT.  ETT instructions reviewed with patient.

## 2021-04-01 NOTE — Telephone Encounter (Signed)
-----   Message from Jettie Booze, MD sent at 03/29/2021 11:06 AM EDT ----- Normal LV function.  MV repair functioning well.  Plan to schedule him for ETT given his complaint of decreased stamina. Will discuss stress test at his upcoming visit.

## 2021-04-06 ENCOUNTER — Ambulatory Visit (INDEPENDENT_AMBULATORY_CARE_PROVIDER_SITE_OTHER): Payer: Medicare Other

## 2021-04-06 ENCOUNTER — Other Ambulatory Visit: Payer: Self-pay

## 2021-04-06 DIAGNOSIS — Z9889 Other specified postprocedural states: Secondary | ICD-10-CM

## 2021-04-06 DIAGNOSIS — I340 Nonrheumatic mitral (valve) insufficiency: Secondary | ICD-10-CM | POA: Diagnosis not present

## 2021-04-06 DIAGNOSIS — R5383 Other fatigue: Secondary | ICD-10-CM

## 2021-04-07 LAB — EXERCISE TOLERANCE TEST
Angina Index: 0
Base ST Depression (mm): 0 mm
Duke Treadmill Score: 8
Estimated workload: 9.6
Exercise duration (min): 7 min
Exercise duration (sec): 42 s
MPHR: 153 {beats}/min
Peak HR: 131 {beats}/min
Percent HR: 85 %
RPE: 17
Rest HR: 60 {beats}/min
ST Depression (mm): 0 mm

## 2021-08-25 DIAGNOSIS — Z85820 Personal history of malignant melanoma of skin: Secondary | ICD-10-CM | POA: Diagnosis not present

## 2021-08-25 DIAGNOSIS — Z1389 Encounter for screening for other disorder: Secondary | ICD-10-CM | POA: Diagnosis not present

## 2021-08-25 DIAGNOSIS — N1831 Chronic kidney disease, stage 3a: Secondary | ICD-10-CM | POA: Diagnosis not present

## 2021-08-25 DIAGNOSIS — E7800 Pure hypercholesterolemia, unspecified: Secondary | ICD-10-CM | POA: Diagnosis not present

## 2021-08-25 DIAGNOSIS — Z0000 Encounter for general adult medical examination without abnormal findings: Secondary | ICD-10-CM | POA: Diagnosis not present

## 2021-09-28 DIAGNOSIS — H52203 Unspecified astigmatism, bilateral: Secondary | ICD-10-CM | POA: Diagnosis not present

## 2021-09-28 DIAGNOSIS — H2513 Age-related nuclear cataract, bilateral: Secondary | ICD-10-CM | POA: Diagnosis not present

## 2021-09-28 DIAGNOSIS — H02831 Dermatochalasis of right upper eyelid: Secondary | ICD-10-CM | POA: Diagnosis not present

## 2021-10-21 DIAGNOSIS — Z85828 Personal history of other malignant neoplasm of skin: Secondary | ICD-10-CM | POA: Diagnosis not present

## 2021-10-21 DIAGNOSIS — D2371 Other benign neoplasm of skin of right lower limb, including hip: Secondary | ICD-10-CM | POA: Diagnosis not present

## 2021-10-21 DIAGNOSIS — Z85820 Personal history of malignant melanoma of skin: Secondary | ICD-10-CM | POA: Diagnosis not present

## 2021-10-21 DIAGNOSIS — D225 Melanocytic nevi of trunk: Secondary | ICD-10-CM | POA: Diagnosis not present

## 2021-10-21 DIAGNOSIS — D1801 Hemangioma of skin and subcutaneous tissue: Secondary | ICD-10-CM | POA: Diagnosis not present

## 2021-10-21 DIAGNOSIS — D2272 Melanocytic nevi of left lower limb, including hip: Secondary | ICD-10-CM | POA: Diagnosis not present

## 2021-10-21 DIAGNOSIS — D2262 Melanocytic nevi of left upper limb, including shoulder: Secondary | ICD-10-CM | POA: Diagnosis not present

## 2021-10-21 DIAGNOSIS — L570 Actinic keratosis: Secondary | ICD-10-CM | POA: Diagnosis not present

## 2021-10-21 DIAGNOSIS — L814 Other melanin hyperpigmentation: Secondary | ICD-10-CM | POA: Diagnosis not present

## 2022-04-05 NOTE — Progress Notes (Unsigned)
Cardiology Office Note   Date:  04/06/2022   ID:  John Page, DOB Oct 31, 1955, MRN [id number]  PCP:  Maury Dus, MD    No chief complaint on file.  S/p MVR  Wt Readings from Last 3 Encounters:  04/06/22 162 lb (73.5 kg)  12/04/20 158 lb (71.7 kg)  10/01/18 155 lb (70.3 kg)       History of Present Illness: John Page is a 66 y.o. male  who had a new heart murmur found on exam in 2013. He had an echo showing MVP with moderate MR.   History of melanoma requiring surgery.     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 MR difficult   to quantitate due to eccentricity of jet that is directed   anteriorly and toward the septum. Unable to perfom accurate   assessment of MR with PISA. Consider TEE for better assessment.   Mildly dilated ascending aorta.   No change in LV size.  Normal LA size.     He had a mumps vaccine and this caused some testicular pain.    In 2020, echo showed: Left ventricle: The cavity size was normal. Wall thickness was   normal. Systolic function was normal. The estimated ejection   fraction was in the range of 60% to 65%. Wall motion was normal;   there were no regional wall motion abnormalities. Features are   consistent with a pseudonormal left ventricular filling pattern,   with concomitant abnormal relaxation and increased filling   pressure (grade 2 diastolic dysfunction). Doppler parameters are   consistent with high ventricular filling pressure. - Ascending aorta: The ascending aorta was mildly dilated. - Mitral valve: Calcified annulus. Mildly thickened leaflets .   Severe prolapse, involving the posterior leaflet. There was   severe regurgitation directed eccentrically and anteriorly. - Left atrium: The atrium was mildly dilated.   Impressions:   - Normal LV systolic function; moderate diastolic dysfunction;   mildly dilated ascending aorta; probable flail posterior MV   leaflet with severe  eccentric MR; mild LAE.   He had MV Repair and PFO repair in 08/2018.    ET in 03/2021: 7:42 on the treadmill.    Walks for exercise.  Goes to the park and walks.  Denies : Chest pain. Dizziness. Leg edema. Nitroglycerin use. Orthopnea. Palpitations. Paroxysmal nocturnal dyspnea. Shortness of breath. Syncope.      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   Complex valvuloplasty including triangular resection of posterior leaflet, artificial Gore-tex neochords x6 and 30 mm Sorin Memo 4D ring annuloplasty via right mini thoracotomy approach   S/P patent foramen ovale closure 09/05/2018    Past Surgical History:  Procedure Laterality Date   ABDOMINAL AORTOGRAM N/A 08/24/2018   Procedure: ABDOMINAL AORTOGRAM;  Surgeon: John Booze, MD;  Location: [facility];  Service: Cardiovascular;  Laterality: N/A;   CARDIAC CATHETERIZATION     HERNIA REPAIR     INGUINAL HERNIA REPAIR Left 10/09/2017   Procedure: LEFT INGUINAL HERNIA REPAIR;  Surgeon: John Skeans, MD;  Location: [place];  Service: General;  Laterality: Left;   INSERTION OF MESH Left 10/09/2017   Procedure: INSERTION OF MESH;  Surgeon: John Skeans,  MD;  Location: [place];  Service: General;  Laterality: Left;   MELANOMA EXCISION     MITRAL VALVE REPAIR Right 09/05/2018   Procedure: MINIMALLY INVASIVE MITRAL VALVE REPAIR (MVR), USING MEMO 4D 30MM AND CLOSURE OF PATENT FORAMEN OVALE;  Surgeon: John Alberts, MD;  Location: [place];  Service: Open Heart Surgery;  Laterality: Right;   RIGHT/LEFT HEART CATH AND CORONARY ANGIOGRAPHY N/A 08/24/2018   Procedure: RIGHT/LEFT HEART CATH AND CORONARY ANGIOGRAPHY;  Surgeon: John Booze, MD;  Location: [facility];  Service: Cardiovascular;  Laterality: N/A;   TEE WITHOUT  CARDIOVERSION N/A 08/24/2018   Procedure: TRANSESOPHAGEAL ECHOCARDIOGRAM (TEE);  Surgeon: John Records, MD;  Location: [facility];  Service: Cardiovascular;  Laterality: N/A;   TEE WITHOUT CARDIOVERSION N/A 09/05/2018   Procedure: TRANSESOPHAGEAL ECHOCARDIOGRAM (TEE);  Surgeon: John Alberts, MD;  Location: [facility];  Service: Open Heart Surgery;  Laterality: N/A;   TONSILLECTOMY       Current Outpatient Medications  Medication Sig Dispense Refill   acetaminophen (TYLENOL) 500 MG tablet Take 1,000 mg by mouth every 6 (six) hours as needed for moderate pain or headache.      amoxicillin (AMOXIL) 500 MG capsule 4 CAPSULES 1 HOUR PRIOR TO DENTAL 4 capsule 3   aspirin EC 81 MG EC tablet Take 1 tablet (81 mg total) by mouth daily.     atorvastatin (LIPITOR) 10 MG tablet Take 1 tablet by mouth at bedtime.     No current facility-administered medications for this visit.    Allergies:   Patient has no known allergies.    Social History:  The patient  reports that he quit smoking about 37 years ago. He has never used smokeless tobacco. He reports that he does not use drugs.   Family History:  The patient's family history includes Heart attack in his mother; Heart disease in his mother; Hypertension in his brother and mother; Lung cancer in his father.    ROS:  Please see the history of present illness.   Otherwise, review of systems are positive for mild renal disease.   All other systems are reviewed and negative.    PHYSICAL EXAM: VS:  BP (!) 126/58   Pulse 67   Ht '5\' 10"'$  (1.778 m)   Wt 162 lb (73.5 kg)   SpO2 97%   BMI 23.24 kg/m  , BMI Body mass index is 23.24 kg/m. GEN: Well nourished, well developed, in no acute distress HEENT: normal Neck: no JVD, carotid bruits, or masses Cardiac: RRR; no murmurs, rubs, or gallops,no edema  Respiratory:  clear to auscultation bilaterally, normal work of breathing GI: soft, nontender, nondistended, + BS MS: no deformity or atrophy Skin:  warm and dry, no rash Neuro:  Strength and sensation are intact Psych: euthymic mood, full affect   EKG:   The ekg ordered today demonstrates NSR   Recent Labs: No results found for requested labs within last 365 days.   Lipid Panel    Component Value Date/Time   CHOL 205 (H) 10/02/2018 0745   TRIG 75 10/02/2018 0745   HDL 59 10/02/2018 0745   CHOLHDL 3.5 10/02/2018 0745   LDLCALC 131 (H) 10/02/2018 0745     Other studies Reviewed: Additional studies/ Page that were reviewed today with results demonstrating: labs reviewed.   ASSESSMENT AND PLAN:  S/p MV repair: Using SBE prophylaxis. No CHF.  Hyperlipidemia: , Whole food plant-based diet.  High-fiber diet.  Avoid processed foods.  Total cholesterol  179 HDL 66 LDL 100 triglycerides 72 in 2/23. PAF: No sx.  Around the time of surgery.    Current medicines are reviewed at length with the patient today.  The patient concerns regarding his medicines were addressed.  The following changes have been made:  No change  Labs/ tests ordered today include:  No orders of the defined types were placed in this encounter.   Recommend 150 minutes/week of aerobic exercise Low fat, low carb, high fiber diet recommended  Disposition:   FU in 1 year   Signed, Larae Grooms, MD  04/06/2022 3:40 PM    [facility] [place], [place], [place]  [address] Phone: [phone]; Fax: [phone]

## 2022-04-06 ENCOUNTER — Ambulatory Visit: Payer: Medicare Other | Attending: Interventional Cardiology | Admitting: Interventional Cardiology

## 2022-04-06 VITALS — BP 126/58 | HR 67 | Ht 70.0 in | Wt 162.0 lb

## 2022-04-06 DIAGNOSIS — Z9889 Other specified postprocedural states: Secondary | ICD-10-CM | POA: Diagnosis not present

## 2022-04-06 DIAGNOSIS — I480 Paroxysmal atrial fibrillation: Secondary | ICD-10-CM

## 2022-04-06 DIAGNOSIS — I340 Nonrheumatic mitral (valve) insufficiency: Secondary | ICD-10-CM | POA: Diagnosis not present

## 2022-04-06 DIAGNOSIS — E782 Mixed hyperlipidemia: Secondary | ICD-10-CM

## 2022-04-06 NOTE — Patient Instructions (Signed)
Medication Instructions:  Your physician recommends that you continue on your current medications as directed. Please refer to the Current Medication list given to you today.  *If you need a refill on your cardiac medications before your next appointment, please call your pharmacy*   Lab Work: none If you have labs (blood work) drawn today and your tests are completely normal, you will receive your results only by: MyChart Message (if you have MyChart) OR A paper copy in the mail If you have any lab test that is abnormal or we need to change your treatment, we will call you to review the results.   Testing/Procedures: none   Follow-Up: At [facility], you and your health needs are our priority.  As part of our continuing mission to provide you with exceptional heart care, we have created designated Provider Care Teams.  These Care Teams include your primary Cardiologist (physician) and Advanced Practice Providers (APPs -  Physician Assistants and Nurse Practitioners) who all work together to provide you with the care you need, when you need it.  We recommend signing up for the patient portal called "MyChart".  Sign up information is provided on this After Visit Summary.  MyChart is used to connect with patients for Virtual Visits (Telemedicine).  Patients are able to view lab/test results, encounter notes, upcoming appointments, etc.  Non-urgent messages can be sent to your provider as well.   To learn more about what you can do with MyChart, go to [email or web address].    Your next appointment:   12 month(s)  The format for your next appointment:   In Person  Provider:   Jayadeep Varanasi, MD     Other Instructions    Important Information About Sugar       

## 2022-09-12 ENCOUNTER — Other Ambulatory Visit: Payer: Self-pay

## 2022-09-12 MED ORDER — AMOXICILLIN 500 MG PO CAPS: ORAL_CAPSULE | ORAL | 3 refills | Status: DC

## 2022-09-13 ENCOUNTER — Telehealth: Payer: Self-pay | Admitting: Interventional Cardiology

## 2022-09-13 NOTE — Telephone Encounter (Signed)
Pt c/o medication issue:  1. Name of Medication:  amoxicillin (AMOXIL) 500 MG capsule  2. How are you currently taking this medication (dosage and times per day)?   3. Are you having a reaction (difficulty breathing--STAT)?   4. What is your medication issue? Pt is requesting call back to discuss medication and kidney disease. He states that he has been told he has stage 3 kidney disease and medication warnings state to call and consult doctor before use. Please advise.

## 2022-09-13 NOTE — Telephone Encounter (Signed)
Patient stated he was advised by Dr. Irish Lack to take Amoxicillin prior to dental appointments. Pt stated he did some research and wanted to clarify if  this is still recommended. Will forward to MD and Pharm D.

## 2022-09-13 NOTE — Telephone Encounter (Signed)
Pt is s/p mitral valve repair and PFO closure - he should continue taking amoxicillin prior to dental work and cleanings. No dose adjustment is needed for amoxicillin in patients with GFR > 30. His most recent GFR in [place] from 08/2021 was above this threshold at 46.

## 2022-09-13 NOTE — Telephone Encounter (Signed)
Spoke with pt and advised of recommendation as below per [facility].  Pt verbalizes understanding and thanked Therapist, sports for the call.

## 2022-09-29 DIAGNOSIS — H2513 Age-related nuclear cataract, bilateral: Secondary | ICD-10-CM | POA: Diagnosis not present

## 2022-09-29 DIAGNOSIS — H02831 Dermatochalasis of right upper eyelid: Secondary | ICD-10-CM | POA: Diagnosis not present

## 2022-09-29 DIAGNOSIS — H524 Presbyopia: Secondary | ICD-10-CM | POA: Diagnosis not present

## 2022-09-29 DIAGNOSIS — H52203 Unspecified astigmatism, bilateral: Secondary | ICD-10-CM | POA: Diagnosis not present

## 2022-09-29 DIAGNOSIS — H02834 Dermatochalasis of left upper eyelid: Secondary | ICD-10-CM | POA: Diagnosis not present

## 2022-10-13 DIAGNOSIS — I480 Paroxysmal atrial fibrillation: Secondary | ICD-10-CM | POA: Diagnosis not present

## 2022-10-13 DIAGNOSIS — Z0000 Encounter for general adult medical examination without abnormal findings: Secondary | ICD-10-CM | POA: Diagnosis not present

## 2022-10-13 DIAGNOSIS — E7800 Pure hypercholesterolemia, unspecified: Secondary | ICD-10-CM | POA: Diagnosis not present

## 2022-10-13 DIAGNOSIS — R030 Elevated blood-pressure reading, without diagnosis of hypertension: Secondary | ICD-10-CM | POA: Diagnosis not present

## 2022-10-13 DIAGNOSIS — Z85820 Personal history of malignant melanoma of skin: Secondary | ICD-10-CM | POA: Diagnosis not present

## 2022-10-13 DIAGNOSIS — N1831 Chronic kidney disease, stage 3a: Secondary | ICD-10-CM | POA: Diagnosis not present

## 2022-10-27 DIAGNOSIS — L821 Other seborrheic keratosis: Secondary | ICD-10-CM | POA: Diagnosis not present

## 2022-10-27 DIAGNOSIS — Z85828 Personal history of other malignant neoplasm of skin: Secondary | ICD-10-CM | POA: Diagnosis not present

## 2022-10-27 DIAGNOSIS — Z85820 Personal history of malignant melanoma of skin: Secondary | ICD-10-CM | POA: Diagnosis not present

## 2022-10-27 DIAGNOSIS — D1801 Hemangioma of skin and subcutaneous tissue: Secondary | ICD-10-CM | POA: Diagnosis not present

## 2022-10-27 DIAGNOSIS — L111 Transient acantholytic dermatosis [Grover]: Secondary | ICD-10-CM | POA: Diagnosis not present

## 2022-10-27 DIAGNOSIS — L814 Other melanin hyperpigmentation: Secondary | ICD-10-CM | POA: Diagnosis not present

## 2023-05-25 NOTE — Progress Notes (Addendum)
Cardiology Office Note    Date:  05/26/2023  ID:  John Page, DOB 03-16-1956, MRN [id number] PCP:  Elias Else, MD (Inactive)  Cardiologist:  Lance Muss, MD  Electrophysiologist:  None   Chief Complaint: f/u mitral valve disease  History of Present Illness: .    John Page is a 67 y.o. male with visit-pertinent history of severe MR/MVP s/p mitral valve repair and closure of PFO 08/2018, moderate mitral stenosis by subsequent echo, transient post-op atrial fibrillation, mild dilation of ascending aorta by echo 03/2021, HLD, melanoma seen for follow-up. Mitral valve disease was identified after murmur heard at PCP office. Pre-procedure cath showed no significant CAD or AAA. ETT 03/2021 was negative. Last echo 03/2021 showed EF 55-60%, G1DD, s/p MV repair, trivial MR, moderate MS, aortic sclerosis without stenosis, mild dilation of ascending aorta. Though not explicitly stated in prior general cardiology notes, appears he had an EKG during 2020 admission that demonstrated atrial fibrillation. He was on warfarin for a period of time post-operatively, discontinued end of April 2020. He has not had any clinical recurrence.  He returns for follow-up today overall feeling well without any interim cardiac symptoms. He denies any CP, SOB, palpitations, orthopnea, edema, or syncope. He asked excellent questions about his renal function and cholesterol. We discussed his labwork from March 2024 showing CKD of unclear etiology. He does not use NSAIDs. He adheres to SBE ppx.  Labwork independently reviewed: [facility] 09/2022 BUN 22, Cr 1.76, Hgb 14.7, trig 93, LDL 115, TSH wnl, HDL 65, plt 262, LFTs ok  7829 LDL 131, trig 75, K 3.7, Cr 1.47, Hgb 14.8, plt 128, Mg OK  ROS: .    Please see the history of present illness.  All other systems are reviewed and otherwise negative.  Studies Reviewed: Marland Kitchen    EKG:  EKG is ordered today, personally reviewed, demonstrating SB 55bpm, nonspecific early ST  upsloping inferiorly, V5-V6 and TWI avL similar to prior  CV Studies: Cardiac studies reviewed are outlined and summarized above. Otherwise please see EMR for full report.   Current Reported Medications:.    Current Meds  Medication Sig   acetaminophen (TYLENOL) 500 MG tablet Take 1,000 mg by mouth every 6 (six) hours as needed for moderate pain or headache.    amoxicillin (AMOXIL) 500 MG capsule 4 CAPSULES 1 HOUR PRIOR TO DENTAL   aspirin EC 81 MG EC tablet Take 1 tablet (81 mg total) by mouth daily.   atorvastatin (LIPITOR) 10 MG tablet Take 1 tablet by mouth at bedtime.    Physical Exam:    VS:  BP 132/84   Pulse 66   Ht 5' 10.5" (1.791 m)   Wt 159 lb 12.8 oz (72.5 kg)   SpO2 97%   BMI 22.60 kg/m    Wt Readings from Last 3 Encounters:  05/26/23 159 lb 12.8 oz (72.5 kg)  04/06/22 162 lb (73.5 kg)  12/04/20 158 lb (71.7 kg)    GEN: Well nourished, well developed in no acute distress NECK: No JVD; No carotid bruits CARDIAC: RRR, no murmurs, rubs, gallops RESPIRATORY:  Clear to auscultation without rales, wheezing or rhonchi  ABDOMEN: Soft, non-tender, non-distended EXTREMITIES:  No edema; No acute deformity   Asessement and Plan:.    1. H/o severe MR/MVP s/p MV repair with residual moderate mitral stenosis, mild dilation of ascending aorta - clinically doing great without any interim symptoms. Continue ASA. He observes SBE ppx with amoxicillin on file 1 hour before dental  work. He will notify if additional refills are needed. We will update echo given moderate mitral stenosis and mild dilation of aorta seen in 2022.  2. Hyperlipidemia - we discussed statin therapy and rationale for treatment. I would favor continuation. His lipids have been managed in recent years by PCP so will defer dosing to primary care, but we are happy to help if needed going forward.  3. CKD 3b - denies use of NSAIDs. No formal hx of HTN or DM. BP top normal today, will need to keep an eye on this  going forward, but would not explain progression of renal dysfunction. Will obtain baseline UA, renal US, and refer to nephrology. Update BMET, CBC today. The patient was instructed to monitor their blood pressure at home and to call if tending to run higher than 130/80.  4. Post-op atrial fibrillation, remotely around time of surgery - no clinical recurrence. Was not felt to require long term anticoagulation in remote visits. We discussed notifying for any abnormal heart rates or palpitations which would prompt event monitoring and reconsideration of anticoagulation.    Disposition: F/u with Dr. Lynnette Caffey to establish care in 1 year, sooner if testing abnormal.  Signed, Laurann Montana, PA-C

## 2023-05-26 ENCOUNTER — Other Ambulatory Visit: Payer: Self-pay | Admitting: Physician Assistant

## 2023-05-26 ENCOUNTER — Ambulatory Visit: Payer: Medicare Other | Attending: Physician Assistant | Admitting: Physician Assistant

## 2023-05-26 ENCOUNTER — Encounter: Payer: Self-pay | Admitting: Physician Assistant

## 2023-05-26 VITALS — BP 132/84 | HR 66 | Ht 70.5 in | Wt 159.8 lb

## 2023-05-26 DIAGNOSIS — Z9889 Other specified postprocedural states: Secondary | ICD-10-CM

## 2023-05-26 DIAGNOSIS — I77810 Thoracic aortic ectasia: Secondary | ICD-10-CM | POA: Diagnosis not present

## 2023-05-26 DIAGNOSIS — I059 Rheumatic mitral valve disease, unspecified: Secondary | ICD-10-CM | POA: Diagnosis not present

## 2023-05-26 DIAGNOSIS — E785 Hyperlipidemia, unspecified: Secondary | ICD-10-CM | POA: Diagnosis not present

## 2023-05-26 DIAGNOSIS — I4891 Unspecified atrial fibrillation: Secondary | ICD-10-CM | POA: Diagnosis not present

## 2023-05-26 DIAGNOSIS — I9789 Other postprocedural complications and disorders of the circulatory system, not elsewhere classified: Secondary | ICD-10-CM

## 2023-05-26 DIAGNOSIS — I050 Rheumatic mitral stenosis: Secondary | ICD-10-CM

## 2023-05-26 DIAGNOSIS — N1832 Chronic kidney disease, stage 3b: Secondary | ICD-10-CM | POA: Diagnosis not present

## 2023-05-26 NOTE — Patient Instructions (Addendum)
Medication Instructions:  Your physician recommends that you continue on your current medications as directed. Please refer to the Current Medication list given to you today.  *If you need a refill on your cardiac medications before your next appointment, please call your pharmacy*   Lab Work: TODAY: BMET, CBC, URINALYSIS If you have labs (blood work) drawn today and your tests are completely normal, you will receive your results only by: MyChart Message (if you have MyChart) OR A paper copy in the mail If you have any lab test that is abnormal or we need to change your treatment, we will call you to review the results.   Testing/Procedures: Your physician has requested that you have an echocardiogram. Echocardiography is a painless test that uses sound waves to create images of your heart. It provides your doctor with information about the size and shape of your heart and how well your heart's chambers and valves are working. This procedure takes approximately one hour. There are no restrictions for this procedure. Please do NOT wear cologne, perfume, aftershave, or lotions (deodorant is allowed). Please arrive 15 minutes prior to your appointment time.  Please note: We ask at that you not bring children with you during ultrasound (echo/ vascular) testing. Due to room size and safety concerns, children are not allowed in the ultrasound rooms during exams. Our front office staff cannot provide observation of children in our lobby area while testing is being conducted. An adult accompanying a patient to their appointment will only be allowed in the ultrasound room at the discretion of the ultrasound technician under special circumstances. We apologize for any inconvenience.   YOUR PROVIDER RECOMMENDS THAT YOU HAVE A RENAL ULTRASOUND   Follow-Up: At [facility], you and your health needs are our priority.  As part of our continuing mission to provide you with exceptional heart care, we  have created designated Provider Care Teams.  These Care Teams include your primary Cardiologist (physician) and Advanced Practice Providers (APPs -  Physician Assistants and Nurse Practitioners) who all work together to provide you with the care you need, when you need it.  We recommend signing up for the patient portal called "MyChart".  Sign up information is provided on this After Visit Summary.  MyChart is used to connect with patients for Virtual Visits (Telemedicine).  Patients are able to view lab/test results, encounter notes, upcoming appointments, etc.  Non-urgent messages can be sent to your provider as well.   To learn more about what you can do with MyChart, go to [email or web address].    Your next appointment:   1 year(s)  Provider:   DR. Lynnette Caffey    Kidney Disease Patients with kidney issues should generally stay away from medicines like ibuprofen, Advil, Motrin, naproxen, and Aleve due to risk of worsening kidney function.   Endocarditis Information  You may be at risk for developing endocarditis since you have an artificial heart valve or a repaired heart valve. Endocarditis is an infection of the lining of the heart or heart valves. Certain surgical and dental procedures may put you at risk, such as teeth cleaning or other dental procedures or other medical procedures. You will need to take antibiotics before certain procedures. To prevent endocarditis, maintain good oral health. Seek prompt medical attention for any mouth/gum, skin or urinary tract infections.

## 2023-05-30 ENCOUNTER — Telehealth: Payer: Self-pay

## 2023-05-30 DIAGNOSIS — E785 Hyperlipidemia, unspecified: Secondary | ICD-10-CM

## 2023-05-30 LAB — BASIC METABOLIC PANEL
BUN/Creatinine Ratio: 17 (ref 10–24)
BUN: 30 mg/dL — ABNORMAL HIGH (ref 8–27)
CO2: 21 mmol/L (ref 20–29)
Calcium: 10.3 mg/dL — ABNORMAL HIGH (ref 8.6–10.2)
Chloride: 103 mmol/L (ref 96–106)
Creatinine, Ser: 1.72 mg/dL — ABNORMAL HIGH (ref 0.76–1.27)
Glucose: 83 mg/dL (ref 70–99)
Potassium: 5.7 mmol/L — ABNORMAL HIGH (ref 3.5–5.2)
Sodium: 141 mmol/L (ref 134–144)
eGFR: 42 mL/min/{1.73_m2} — ABNORMAL LOW (ref >59)

## 2023-05-30 LAB — CBC
Hematocrit: 46 % (ref 37.5–51.0)
Hemoglobin: 15.2 g/dL (ref 13.0–17.7)
MCH: 31.9 pg (ref 26.6–33.0)
MCHC: 33 g/dL (ref 31.5–35.7)
MCV: 96 fL (ref 79–97)
Platelets: 273 10*3/uL (ref 150–450)
RBC: 4.77 x10E6/uL (ref 4.14–5.80)
RDW: 12.5 % (ref 11.6–15.4)
WBC: 8.5 10*3/uL (ref 3.4–10.8)

## 2023-05-30 LAB — URINALYSIS
Bilirubin, UA: NEGATIVE
Glucose, UA: NEGATIVE
Ketones, UA: NEGATIVE
Leukocytes,UA: NEGATIVE
Nitrite, UA: NEGATIVE
Protein,UA: NEGATIVE
RBC, UA: NEGATIVE
Specific Gravity, UA: 1.014 (ref 1.005–1.030)
Urobilinogen, Ur: 0.2 mg/dL (ref 0.2–1.0)
pH, UA: 6 (ref 5.0–7.5)

## 2023-05-30 NOTE — Telephone Encounter (Signed)
Spoke with patient and gave him lab results he is aware his potassium his elevated and needs to be rechecked. He does not want to go to ED. He also stated he will wait to Thursday when he gets he scan.  He states he was just here and is not sure why we checked his blood levels. I assured him that the potassium can cause arrhythmias and with it being elevated it is dangerous. He stated he did not want to come in the morning because he have to work. He will wait until his scan.  I called patient back and asked him was he sure he can not make it to our lab tomorrow. He states he will not go to the ED just to check his potassium. I informed him again how dangerous this could be and we really need to make sure his potassium is WNL. He states he will come tomorrow at lunch time.

## 2023-05-31 ENCOUNTER — Other Ambulatory Visit: Payer: Self-pay | Admitting: *Deleted

## 2023-05-31 ENCOUNTER — Other Ambulatory Visit: Payer: Self-pay

## 2023-05-31 DIAGNOSIS — E785 Hyperlipidemia, unspecified: Secondary | ICD-10-CM

## 2023-05-31 LAB — BASIC METABOLIC PANEL
BUN/Creatinine Ratio: 12 (ref 10–24)
BUN: 20 mg/dL (ref 8–27)
CO2: 23 mmol/L (ref 20–29)
Calcium: 9.9 mg/dL (ref 8.6–10.2)
Chloride: 104 mmol/L (ref 96–106)
Creatinine, Ser: 1.73 mg/dL — ABNORMAL HIGH (ref 0.76–1.27)
Glucose: 97 mg/dL (ref 70–99)
Potassium: 4.5 mmol/L (ref 3.5–5.2)
Sodium: 137 mmol/L (ref 134–144)
eGFR: 42 mL/min/{1.73_m2} — ABNORMAL LOW (ref >59)

## 2023-05-31 NOTE — Addendum Note (Signed)
Addended by: Serita Sheller R on: 05/31/2023 11:27 AM   Modules accepted: Orders

## 2023-06-01 ENCOUNTER — Ambulatory Visit (HOSPITAL_COMMUNITY)
Admission: RE | Admit: 2023-06-01 | Discharge: 2023-06-01 | Disposition: A | Discharge status: Home / Self Care | Payer: Medicare Other | Source: Ambulatory Visit | Attending: Physician Assistant | Admitting: Physician Assistant

## 2023-06-01 DIAGNOSIS — N1832 Chronic kidney disease, stage 3b: Secondary | ICD-10-CM | POA: Insufficient documentation

## 2023-06-13 ENCOUNTER — Ambulatory Visit (HOSPITAL_COMMUNITY): Payer: Medicare Other | Attending: Physician Assistant

## 2023-06-13 DIAGNOSIS — I059 Rheumatic mitral valve disease, unspecified: Secondary | ICD-10-CM | POA: Diagnosis not present

## 2023-06-13 LAB — ECHOCARDIOGRAM COMPLETE
AR max vel: 3.08 cm2
AV Area VTI: 3 cm2
AV Area mean vel: 3.05 cm2
AV Mean grad: 6.4 mm[Hg]
AV Peak grad: 11.8 mm[Hg]
Ao pk vel: 1.71 m/s
Area-P 1/2: 2.14 cm2
MV VTI: 1.56 cm2
S' Lateral: 2.6 cm

## 2023-08-09 ENCOUNTER — Emergency Department (HOSPITAL_COMMUNITY)
Admission: EM | Admit: 2023-08-09 | Discharge: 2023-08-09 | Disposition: A | Discharge status: Home / Self Care | Payer: Medicare Other | Attending: Emergency Medicine | Admitting: Emergency Medicine

## 2023-08-09 ENCOUNTER — Other Ambulatory Visit: Payer: Self-pay

## 2023-08-09 ENCOUNTER — Emergency Department (HOSPITAL_COMMUNITY): Payer: Medicare Other

## 2023-08-09 DIAGNOSIS — N183 Chronic kidney disease, stage 3 unspecified: Secondary | ICD-10-CM | POA: Insufficient documentation

## 2023-08-09 DIAGNOSIS — Z7982 Long term (current) use of aspirin: Secondary | ICD-10-CM | POA: Insufficient documentation

## 2023-08-09 DIAGNOSIS — R079 Chest pain, unspecified: Secondary | ICD-10-CM | POA: Diagnosis not present

## 2023-08-09 DIAGNOSIS — E785 Hyperlipidemia, unspecified: Secondary | ICD-10-CM | POA: Insufficient documentation

## 2023-08-09 DIAGNOSIS — N1832 Chronic kidney disease, stage 3b: Secondary | ICD-10-CM | POA: Insufficient documentation

## 2023-08-09 DIAGNOSIS — Z87891 Personal history of nicotine dependence: Secondary | ICD-10-CM | POA: Diagnosis not present

## 2023-08-09 DIAGNOSIS — Z85820 Personal history of malignant melanoma of skin: Secondary | ICD-10-CM | POA: Insufficient documentation

## 2023-08-09 DIAGNOSIS — D72829 Elevated white blood cell count, unspecified: Secondary | ICD-10-CM | POA: Insufficient documentation

## 2023-08-09 DIAGNOSIS — R0789 Other chest pain: Secondary | ICD-10-CM | POA: Insufficient documentation

## 2023-08-09 DIAGNOSIS — J439 Emphysema, unspecified: Secondary | ICD-10-CM | POA: Diagnosis not present

## 2023-08-09 DIAGNOSIS — I4891 Unspecified atrial fibrillation: Secondary | ICD-10-CM | POA: Insufficient documentation

## 2023-08-09 LAB — BASIC METABOLIC PANEL
Anion gap: 13 (ref 5–15)
BUN: 28 mg/dL — ABNORMAL HIGH (ref 8–23)
CO2: 18 mmol/L — ABNORMAL LOW (ref 22–32)
Calcium: 9.6 mg/dL (ref 8.9–10.3)
Chloride: 106 mmol/L (ref 98–111)
Creatinine, Ser: 2.14 mg/dL — ABNORMAL HIGH (ref 0.61–1.24)
GFR, Estimated: 33 mL/min — ABNORMAL LOW (ref >60)
Glucose, Bld: 130 mg/dL — ABNORMAL HIGH (ref 70–99)
Potassium: 4.9 mmol/L (ref 3.5–5.1)
Sodium: 137 mmol/L (ref 135–145)

## 2023-08-09 LAB — CBC
HCT: 45.8 % (ref 39.0–52.0)
Hemoglobin: 15.4 g/dL (ref 13.0–17.0)
MCH: 32 pg (ref 26.0–34.0)
MCHC: 33.6 g/dL (ref 30.0–36.0)
MCV: 95.2 fL (ref 80.0–100.0)
Platelets: 277 10*3/uL (ref 150–400)
RBC: 4.81 MIL/uL (ref 4.22–5.81)
RDW: 12.7 % (ref 11.5–15.5)
WBC: 10.6 10*3/uL — ABNORMAL HIGH (ref 4.0–10.5)
nRBC: 0 % (ref 0.0–0.2)

## 2023-08-09 LAB — HEPATIC FUNCTION PANEL
ALT: 32 U/L (ref 0–44)
AST: 31 U/L (ref 15–41)
Albumin: 4.1 g/dL (ref 3.5–5.0)
Alkaline Phosphatase: 70 U/L (ref 38–126)
Bilirubin, Direct: <0.1 mg/dL (ref 0.0–0.2)
Total Bilirubin: 0.7 mg/dL (ref 0.0–1.2)
Total Protein: 7.6 g/dL (ref 6.5–8.1)

## 2023-08-09 LAB — TROPONIN I (HIGH SENSITIVITY)
Troponin I (High Sensitivity): 4 ng/L (ref <18)
Troponin I (High Sensitivity): 5 ng/L (ref <18)

## 2023-08-09 LAB — LIPASE, BLOOD: Lipase: 51 U/L (ref 11–51)

## 2023-08-09 MED ORDER — ASPIRIN 81 MG PO CHEW
324.0000 mg | CHEWABLE_TABLET | Freq: Once | ORAL | Status: AC
  Administered 2023-08-09: 324 mg via ORAL
  Filled 2023-08-09: qty 4

## 2023-08-09 NOTE — Progress Notes (Unsigned)
  Cardiology Office Note:   Date:  08/10/2023  ID:  JAIMEY MCWAIN, DOB 1956-02-03, MRN [id number] PCP: Elias Else, MD  [facility] Cardiologist:  Lance Muss, MD {  History of Present Illness:   John Page is a 68 y.o. male with visit-pertinent history of severe MR/MVP s/p mitral valve repair and closure of PFO 08/2018, moderate mitral stenosis by subsequent echo, transient post-op atrial fibrillation, mild dilation of ascending aorta by echo 03/2021, and HLD.    He was in the emergency room yesterday.  He had had chest discomfort Tuesday night.  It was sharp.  It was intense.  It was brief squeezing and stabbing.  There was some position allergy to it.  It was better sitting forward.  He tried take some baking soda and did not have any improvement.  He went to the emergency room and I did review these records for this visit.  Enzymes were negative.  He is still getting this kind of discomfort.  Today he had somewhat movement moving his left arm above his head and behind his back.  He said that can be 5 out of 10 in intensity.  He has not had any new nausea or vomiting.  He has not any new palpitations, presyncope or syncope.  He is not describing any new shortness of breath, PND or orthopnea.   ROS: As stated in the HPI and negative for all other systems.  Studies Reviewed:    EKG:   EKG 08/09/2023 demonstrated sinus rhythm, rate 89, no acute ST-T wave changes.  Risk Assessment/Calculations:      Physical Exam:   VS:  BP (!) 160/90   Pulse 81   Ht 5\' 10"  (1.778 m)   Wt 162 lb (73.5 kg)   SpO2 97%   BMI 23.24 kg/m    Wt Readings from Last 3 Encounters:  08/10/23 162 lb (73.5 kg)  08/09/23 160 lb (72.6 kg)  05/26/23 159 lb 12.8 oz (72.5 kg)     GEN: Well nourished, well developed in no acute distress NECK: No JVD; No carotid bruits CARDIAC: RRR, soft apical systolic murmurs, rubs, gallops RESPIRATORY:  Clear to auscultation without rales, wheezing or  rhonchi  ABDOMEN: Soft, non-tender, non-distended EXTREMITIES:  No edema; No deformity   ASSESSMENT AND PLAN:   H/o severe MR/MVP s/p MV repair with residual moderate mitral stenosis: He will be followed with an echo again in November for this but I will be checking an echo as below.  Chest pain: He is worried about pericarditis.  I am going to check an ESR and a sedimentation rate though I have a low clinical suspicion.  We will check an echocardiogram limited for pericardial effusion as well.  Of note he has renal insufficiency so I do not want to prescribe the usual therapies without a diagnosis.  More likely has a musculoskeletal etiology and could take some Tylenol.     CKD 3b: His creatinine was elevated 2.14 which is above his baseline but he has his first appointment with a nephrologist coming up and I agree with this.  Post-op atrial fibrillation: This was postop.  He is had no further dysrhythmias identified.  No change in therapy.     Follow up will be pending the results of the studies above.  If he has resolution of symptoms and no evidence pericarditis and we would see him back in the fall after his complete echo.  Signed, Rollene Rotunda, MD

## 2023-08-09 NOTE — ED Provider Triage Note (Signed)
Emergency Medicine Provider Triage Evaluation Note  John Page , a 68 y.o. male  was evaluated in triage.  Pt complains of chest discomfort that comes and goes that began at 10 PM last night.  Patient does have history of mitral valve repair 4 years ago and does take a baby aspirin daily.  Patient denies any shortness of breath or hemoptysis or leg swelling.  Patient is unsure what causes the chest pain but states that it comes and goes.  There is no radiation of the chest pain.  Review of Systems  Positive:  Negative:   Physical Exam  BP (!) 143/97 (BP Location: Left Arm)   Pulse 89   Temp 97.6 F (36.4 C)   Resp 18   Ht 5\' 10"  (1.778 m)   Wt 72.6 kg   SpO2 100%   BMI 22.96 kg/m  Gen:   Awake, no distress   Resp:  Normal effort  MSK:   Moves extremities without difficulty  Other:  No leg swelling or calf tenderness  Medical Decision Making  Medically screening exam initiated at 7:53 AM.  Appropriate orders placed.  Elwin Mocha was informed that the remainder of the evaluation will be completed by another provider, this initial triage assessment does not replace that evaluation, and the importance of remaining in the ED until their evaluation is complete.  Workup initiated, patient thinks he may have acid reflux as he did have [employer] and chili last night and so we will add on hepatic function panel and lipase but does not have any abdominal tenderness, will give full dose aspirin here in triage, patient stable.   Netta Corrigan, [place] 08/09/23 [phone]

## 2023-08-09 NOTE — ED Provider Notes (Signed)
[place] EMERGENCY DEPARTMENT AT [facility] Provider Note   CSN: [id number] Arrival date & time: 08/09/23  8657     History  Chief Complaint  Patient presents with   Chest Pain    John Page is a 68 y.o. male.  68 year old male with past medical history significant for mitral valve replacement presents today for concern of chest discomfort that started last night around 10 PM.  He initially thought that this was heartburn so he makes some baking soda and water and drink it.  He thought initially helped however the chest discomfort returned again.  This is left-sided and lasts only for few seconds and occurs randomly.  No alleviating or aggravating factors identified.  No prior history of CAD.  No associated diaphoresis, nausea, lightheadedness, or palpitations.  He is not on a proton pump inhibitor for his acid reflux.  He states occasionally he takes the baking soda mixed in water.  Denies any recent long travel, no shortness of breath, hemoptysis, recent surgery, history of blood clots.  Patient is fairly active and states he walks 2 to 3 miles a few times a week.  The history is provided by the patient. No language interpreter was used.       Home Medications Prior to Admission medications   Medication Sig Start Date End Date Taking? Authorizing Provider  acetaminophen (TYLENOL) 500 MG tablet Take 1,000 mg by mouth every 6 (six) hours as needed for moderate pain or headache.    Yes [provider]  aspirin EC 81 MG EC tablet Take 1 tablet (81 mg total) by mouth daily. 09/11/18  Yes Doree Fudge M, PA-C  atorvastatin (LIPITOR) 10 MG tablet Take 1 tablet by mouth at bedtime. 09/19/20  Yes [provider]  amoxicillin (AMOXIL) 500 MG capsule 4 CAPSULES 1 HOUR PRIOR TO DENTAL Patient not taking: Reported on 08/09/2023 09/12/22   Corky Crafts, MD      Allergies    Patient has no known allergies.    Review of Systems   Review of Systems   Constitutional:  Negative for chills and fever.  Respiratory:  Negative for cough and shortness of breath.   Cardiovascular:  Positive for chest pain. Negative for palpitations and leg swelling.  Gastrointestinal:  Negative for nausea.  Neurological:  Negative for light-headedness.  All other systems reviewed and are negative.   Physical Exam Updated Vital Signs BP (!) 143/87   Pulse 73   Temp 97.6 F (36.4 C)   Resp 16   Ht 5\' 10"  (1.778 m)   Wt 72.6 kg   SpO2 97%   BMI 22.96 kg/m  Physical Exam Vitals and nursing note reviewed.  Constitutional:      General: He is not in acute distress.    Appearance: Normal appearance. He is not ill-appearing.  HENT:     Head: Normocephalic and atraumatic.     Nose: Nose normal.  Eyes:     General: No scleral icterus.    Extraocular Movements: Extraocular movements intact.     Conjunctiva/sclera: Conjunctivae normal.  Cardiovascular:     Rate and Rhythm: Normal rate and regular rhythm.  Pulmonary:     Effort: Pulmonary effort is normal. No respiratory distress.     Breath sounds: Normal breath sounds. No wheezing or rales.  Musculoskeletal:        General: Normal range of motion.     Cervical back: Normal range of motion.  Skin:    General:  Skin is warm and dry.  Neurological:     General: No focal deficit present.     Mental Status: He is alert. Mental status is at baseline.     ED Results / Procedures / Treatments   Labs (all labs ordered are listed, but only abnormal results are displayed) Labs Reviewed  BASIC METABOLIC PANEL - Abnormal; Notable for the following components:      Result Value   CO2 18 (*)    Glucose, Bld 130 (*)    BUN 28 (*)    Creatinine, Ser 2.14 (*)    GFR, Estimated 33 (*)    All other components within normal limits  CBC - Abnormal; Notable for the following components:   WBC 10.6 (*)    All other components within normal limits  HEPATIC FUNCTION PANEL  LIPASE, BLOOD  TROPONIN I (HIGH  SENSITIVITY)  TROPONIN I (HIGH SENSITIVITY)    EKG EKG Interpretation Date/Time:  Wednesday August 09 2023 07:20:31 EST Ventricular Rate:  89 PR Interval:  178 QRS Duration:  72 QT Interval:  356 QTC Calculation: 433 R Axis:   60  Text Interpretation: Normal sinus rhythm Nonspecific T wave abnormality Confirmed by Cathren Laine ([id number]) on 08/09/2023 10:29:41 AM  Radiology DG Chest 2 View Result Date: 08/09/2023 CLINICAL DATA:  Chest pain. Former smoker. Previous mitral valve repair. EXAM: CHEST - 2 VIEW COMPARISON:  PA and lateral 10/01/2018. FINDINGS: The heart size and mediastinal contours are within normal limits. Prior mitral valve repair is again noted. Both lungs are slightly emphysematous but clear. The visualized skeletal structures are unremarkable. IMPRESSION: No evidence of acute chest disease. Emphysema.  Stable COPD chest. Electronically Signed   By: Almira Bar M.D.   On: 08/09/2023 07:42    Procedures Procedures    Medications Ordered in ED Medications  aspirin chewable tablet 324 mg (324 mg Oral Given 08/09/23 1007)    ED Course/ Medical Decision Making/ A&P Clinical Course as of 08/09/23 1120  Wed Aug 09, 2023  0921 Anion gap: 13 [AA]    Clinical Course User Index [AA] Marita Kansas, PA-C                                 Medical Decision Making Amount and/or Complexity of Data Reviewed Labs: ordered. Decision-making details documented in ED Course. Radiology: ordered.   Medical Decision Making / ED Course   This patient presents to the ED for concern of chest pain, this involves an extensive number of treatment options, and is a complaint that carries with it a high risk of complications and morbidity.  The differential diagnosis includes ACS, PE, pneumonia, MSK etiology, acid reflux, dissection  MDM: 68 year old male presents today for concern of chest pain.  Moderate heart score.  CBC with mild leukocytosis at 10.6.  No anemia.  No infectious  symptoms.  Afebrile.  BMP with creatinine at 2.14 which is slightly above his baseline but recent blood work I have is from 4 months ago.  He states he is due to see a nephrologist and it is told he has CKD stage III.  He states his baseline GFR hovers around 40.  Hepatic function panel, lipase are within normal.  Troponin negative x 2.  I did ambulate patient in the halls.  He remained without chest pain throughout the emergency room stay.  Low suspicion for ACS.  He was given aspirin 324 mg.  I  did offer patient admission for echo and observation however he states he would prefer to closely follow-up with cardiology.  Cardiology referral given at discharge.  Discharged in stable condition.  Return precaution discussed.  Patient voices understanding and is in agreement with plan.   Lab Tests: -I ordered, reviewed, and interpreted labs.   The pertinent results include:   Labs Reviewed  BASIC METABOLIC PANEL - Abnormal; Notable for the following components:      Result Value   CO2 18 (*)    Glucose, Bld 130 (*)    BUN 28 (*)    Creatinine, Ser 2.14 (*)    GFR, Estimated 33 (*)    All other components within normal limits  CBC - Abnormal; Notable for the following components:   WBC 10.6 (*)    All other components within normal limits  HEPATIC FUNCTION PANEL  LIPASE, BLOOD  TROPONIN I (HIGH SENSITIVITY)  TROPONIN I (HIGH SENSITIVITY)      EKG  EKG Interpretation Date/Time:  Wednesday August 09 2023 07:20:31 EST Ventricular Rate:  89 PR Interval:  178 QRS Duration:  72 QT Interval:  356 QTC Calculation: 433 R Axis:   60  Text Interpretation: Normal sinus rhythm Nonspecific T wave abnormality Confirmed by Cathren Laine ([id number]) on 08/09/2023 10:29:41 AM         Imaging Studies ordered: I ordered imaging studies including cxr I independently visualized and interpreted imaging. I agree with the radiologist interpretation   Medicines ordered and prescription drug  management: Meds ordered this encounter  Medications   aspirin chewable tablet 324 mg    -I have reviewed the patients home medicines and have made adjustments as needed   Reevaluation: After the interventions noted above, I reevaluated the patient and found that they have :improved  Co morbidities that complicate the patient evaluation  Past Medical History:  Diagnosis Date   Cancer (HCC)    melanoma   Heart murmur    Mitral valve disorders(424.0)    severe MVP by 07/25/17 echo   Other and unspecified hyperlipidemia    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  Cardiology referral given.  Return precaution discussed.   Final Clinical Impression(s) / ED Diagnoses Final diagnoses:  Atypical chest pain    Rx / DC Orders ED Discharge Orders          Ordered    Ambulatory referral to Cardiology       Comments: If you have not heard from the Cardiology office within the next 72 hours please call [phone].   08/09/23 1119              Marita Kansas, PA-C 08/09/23 1215    Cathren Laine, MD 08/11/23 [phone]

## 2023-08-09 NOTE — Discharge Instructions (Addendum)
Your workup today was reassuring.  No evidence of heart attack.  Recommend you follow-up with your heart doctor.  Please call their office today.  I have put in a referral as well.  If you have worsening chest pain that is associated with shortness of breath, sweating, nausea please return to the emergency department.  We did discuss admission for observation however you state that she would like to follow-up closely with the cardiology office.  This is reasonable.

## 2023-08-09 NOTE — ED Triage Notes (Signed)
Pt. Stated, I started having chest pain last night off and on. I thought it was indigestion but it continued through the night and this morning. Denies any other symptoms. 5 years ago I had a mitral valve repaired.

## 2023-08-10 ENCOUNTER — Ambulatory Visit: Payer: Medicare Other | Attending: Cardiology | Admitting: Cardiology

## 2023-08-10 ENCOUNTER — Encounter: Payer: Self-pay | Admitting: Cardiology

## 2023-08-10 VITALS — BP 160/90 | HR 81 | Ht 70.0 in | Wt 162.0 lb

## 2023-08-10 DIAGNOSIS — N1832 Chronic kidney disease, stage 3b: Secondary | ICD-10-CM | POA: Diagnosis not present

## 2023-08-10 DIAGNOSIS — I4891 Unspecified atrial fibrillation: Secondary | ICD-10-CM

## 2023-08-10 DIAGNOSIS — I9789 Other postprocedural complications and disorders of the circulatory system, not elsewhere classified: Secondary | ICD-10-CM

## 2023-08-10 DIAGNOSIS — R079 Chest pain, unspecified: Secondary | ICD-10-CM | POA: Diagnosis not present

## 2023-08-10 DIAGNOSIS — E785 Hyperlipidemia, unspecified: Secondary | ICD-10-CM

## 2023-08-10 DIAGNOSIS — Z9889 Other specified postprocedural states: Secondary | ICD-10-CM

## 2023-08-10 NOTE — Patient Instructions (Signed)
Medication Instructions:  No changes. *If you need a refill on your cardiac medications before your next appointment, please call your pharmacy*   Lab Work: CRP and ESR today. If you have labs (blood work) drawn today and your tests are completely normal, you will receive your results only by: MyChart Message (if you have MyChart) OR A paper copy in the mail If you have any lab test that is abnormal or we need to change your treatment, we will call you to review the results.   Testing/Procedures: Your physician has requested that you have an echocardiogram. Echocardiography is a painless test that uses sound waves to create images of your heart. It provides your doctor with information about the size and shape of your heart and how well your heart's chambers and valves are working. This procedure takes approximately one hour. There are no restrictions for this procedure. Please do NOT wear cologne, perfume, aftershave, or lotions (deodorant is allowed). Please arrive 15 minutes prior to your appointment time.  Please note: We ask at that you not bring children with you during ultrasound (echo/ vascular) testing. Due to room size and safety concerns, children are not allowed in the ultrasound rooms during exams. Our front office staff cannot provide observation of children in our lobby area while testing is being conducted. An adult accompanying a patient to their appointment will only be allowed in the ultrasound room at the discretion of the ultrasound technician under special circumstances. We apologize for any inconvenience.    Follow-Up: At [facility], you and your health needs are our priority.  As part of our continuing mission to provide you with exceptional heart care, we have created designated Provider Care Teams.  These Care Teams include your primary Cardiologist (physician) and Advanced Practice Providers (APPs -  Physician Assistants and Nurse Practitioners) who all  work together to provide you with the care you need, when you need it.  We recommend signing up for the patient portal called "MyChart".  Sign up information is provided on this After Visit Summary.  MyChart is used to connect with patients for Virtual Visits (Telemedicine).  Patients are able to view lab/test results, encounter notes, upcoming appointments, etc.  Non-urgent messages can be sent to your provider as well.   To learn more about what you can do with MyChart, go to [email or web address].    Your next appointment:   10 month(s)  Provider:   Rollene Rotunda, MD

## 2023-08-11 DIAGNOSIS — R079 Chest pain, unspecified: Secondary | ICD-10-CM | POA: Diagnosis not present

## 2023-08-12 LAB — SEDIMENTATION RATE: Sed Rate: 13 mm/h (ref 0–30)

## 2023-08-12 LAB — C-REACTIVE PROTEIN: CRP: <1 mg/L (ref 0–10)

## 2023-08-14 ENCOUNTER — Telehealth: Payer: Self-pay

## 2023-08-14 NOTE — Telephone Encounter (Signed)
-----   Message from Rollene Rotunda sent at 08/13/2023 11:39 AM EST ----- Inflammatory markers are negative.  No change in therapy.  Call Mr. Huesman with the results and send results to Elias Else, MD

## 2023-08-14 NOTE — Telephone Encounter (Signed)
Called and spoke with patient to inform him that test results were negative and that I had also sent him a letter today with the results as well.

## 2023-08-15 DIAGNOSIS — I059 Rheumatic mitral valve disease, unspecified: Secondary | ICD-10-CM | POA: Diagnosis not present

## 2023-08-15 DIAGNOSIS — N1832 Chronic kidney disease, stage 3b: Secondary | ICD-10-CM | POA: Diagnosis not present

## 2023-08-20 LAB — LAB REPORT - SCANNED: EGFR: 30

## 2023-08-31 ENCOUNTER — Ambulatory Visit (HOSPITAL_COMMUNITY): Payer: Medicare Other | Attending: Cardiology

## 2023-08-31 DIAGNOSIS — R079 Chest pain, unspecified: Secondary | ICD-10-CM | POA: Diagnosis not present

## 2023-08-31 LAB — ECHOCARDIOGRAM LIMITED
Area-P 1/2: 1.61 cm2
S' Lateral: 2.1 cm

## 2023-09-13 ENCOUNTER — Telehealth: Payer: Self-pay

## 2023-09-13 NOTE — Telephone Encounter (Signed)
 Rollene Rotunda, MD  Delton Prairie, RN This study was done to make sure there is no pericardial effusion or suggestion of pericarditis.  There was no evidence for this.  The mitral valve is stable.  Ejection fraction is normal.  No change in therapy.

## 2023-09-18 DIAGNOSIS — N1832 Chronic kidney disease, stage 3b: Secondary | ICD-10-CM | POA: Diagnosis not present

## 2023-09-27 DIAGNOSIS — E875 Hyperkalemia: Secondary | ICD-10-CM | POA: Diagnosis not present

## 2023-09-27 DIAGNOSIS — N1832 Chronic kidney disease, stage 3b: Secondary | ICD-10-CM | POA: Diagnosis not present

## 2023-09-27 DIAGNOSIS — I4891 Unspecified atrial fibrillation: Secondary | ICD-10-CM | POA: Diagnosis not present

## 2023-10-02 DIAGNOSIS — H02831 Dermatochalasis of right upper eyelid: Secondary | ICD-10-CM | POA: Diagnosis not present

## 2023-10-02 DIAGNOSIS — H5203 Hypermetropia, bilateral: Secondary | ICD-10-CM | POA: Diagnosis not present

## 2023-10-02 DIAGNOSIS — H52203 Unspecified astigmatism, bilateral: Secondary | ICD-10-CM | POA: Diagnosis not present

## 2023-10-02 DIAGNOSIS — H02834 Dermatochalasis of left upper eyelid: Secondary | ICD-10-CM | POA: Diagnosis not present

## 2023-10-16 ENCOUNTER — Other Ambulatory Visit: Payer: Self-pay | Admitting: Interventional Cardiology

## 2023-11-07 DIAGNOSIS — Z0000 Encounter for general adult medical examination without abnormal findings: Secondary | ICD-10-CM | POA: Diagnosis not present

## 2023-11-07 DIAGNOSIS — L814 Other melanin hyperpigmentation: Secondary | ICD-10-CM | POA: Diagnosis not present

## 2023-11-07 DIAGNOSIS — N1831 Chronic kidney disease, stage 3a: Secondary | ICD-10-CM | POA: Diagnosis not present

## 2023-11-07 DIAGNOSIS — Z85828 Personal history of other malignant neoplasm of skin: Secondary | ICD-10-CM | POA: Diagnosis not present

## 2023-11-07 DIAGNOSIS — Z23 Encounter for immunization: Secondary | ICD-10-CM | POA: Diagnosis not present

## 2023-11-07 DIAGNOSIS — H6123 Impacted cerumen, bilateral: Secondary | ICD-10-CM | POA: Diagnosis not present

## 2023-11-07 DIAGNOSIS — Z8679 Personal history of other diseases of the circulatory system: Secondary | ICD-10-CM | POA: Diagnosis not present

## 2023-11-07 DIAGNOSIS — Z85820 Personal history of malignant melanoma of skin: Secondary | ICD-10-CM | POA: Diagnosis not present

## 2023-11-07 DIAGNOSIS — D1801 Hemangioma of skin and subcutaneous tissue: Secondary | ICD-10-CM | POA: Diagnosis not present

## 2023-11-07 DIAGNOSIS — D692 Other nonthrombocytopenic purpura: Secondary | ICD-10-CM | POA: Diagnosis not present

## 2023-11-07 DIAGNOSIS — E7800 Pure hypercholesterolemia, unspecified: Secondary | ICD-10-CM | POA: Diagnosis not present

## 2023-11-07 DIAGNOSIS — I788 Other diseases of capillaries: Secondary | ICD-10-CM | POA: Diagnosis not present

## 2023-11-07 DIAGNOSIS — R030 Elevated blood-pressure reading, without diagnosis of hypertension: Secondary | ICD-10-CM | POA: Diagnosis not present

## 2023-11-07 DIAGNOSIS — L821 Other seborrheic keratosis: Secondary | ICD-10-CM | POA: Diagnosis not present

## 2023-11-20 DIAGNOSIS — N1832 Chronic kidney disease, stage 3b: Secondary | ICD-10-CM | POA: Diagnosis not present

## 2023-12-06 DIAGNOSIS — E559 Vitamin D deficiency, unspecified: Secondary | ICD-10-CM | POA: Diagnosis not present

## 2023-12-06 DIAGNOSIS — N1832 Chronic kidney disease, stage 3b: Secondary | ICD-10-CM | POA: Diagnosis not present
